# Patient Record
Sex: Female | Born: 1995
Health system: Southern US, Community
[De-identification: ages and names within clinical notes are randomized; demographics above are authoritative.]

## PROBLEM LIST (undated history)

## (undated) DIAGNOSIS — Z8759 Personal history of other complications of pregnancy, childbirth and the puerperium: Secondary | ICD-10-CM

## (undated) DIAGNOSIS — D649 Anemia, unspecified: Secondary | ICD-10-CM

## (undated) DIAGNOSIS — M419 Scoliosis, unspecified: Secondary | ICD-10-CM

## (undated) HISTORY — DX: Anemia, unspecified: D64.9

## (undated) HISTORY — PX: OTHER SURGICAL HISTORY: SHX169

## (undated) HISTORY — PX: WISDOM TOOTH EXTRACTION: SHX21

## (undated) HISTORY — DX: Personal history of other complications of pregnancy, childbirth and the puerperium: Z87.59

---
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
## 2014-01-04 ENCOUNTER — Encounter (HOSPITAL_COMMUNITY): Payer: Self-pay | Admitting: Emergency Medicine

## 2014-01-04 ENCOUNTER — Emergency Department (INDEPENDENT_AMBULATORY_CARE_PROVIDER_SITE_OTHER)
Admission: EM | Admit: 2014-01-04 | Discharge: 2014-01-04 | Disposition: A | Discharge status: Home / Self Care | Payer: Self-pay | Source: Home / Self Care | Attending: Family Medicine | Admitting: Family Medicine

## 2014-01-04 DIAGNOSIS — L0231 Cutaneous abscess of buttock: Secondary | ICD-10-CM

## 2014-01-04 DIAGNOSIS — L03317 Cellulitis of buttock: Secondary | ICD-10-CM

## 2014-01-04 MED ORDER — SULFAMETHOXAZOLE-TRIMETHOPRIM 800-160 MG PO TABS: 1.0000 | ORAL_TABLET | Freq: Two times a day (BID) | ORAL | Status: AC

## 2014-01-04 NOTE — ED Provider Notes (Addendum)
CSN: [id number]     Arrival date & time 01/04/14  1716 History   None    Chief Complaint  Patient presents with  . Abscess     (Consider location/radiation/quality/duration/timing/severity/associated sxs/prior Treatment) Patient is a 18 y.o. female presenting with abscess. The history is provided by the patient.  Abscess Location:  Ano-genital Ano-genital abscess location:  R buttock Abscess quality: fluctuance, induration and painful   Red streaking: no   Duration:  5 days Progression:  Worsening Chronicity:  Recurrent (similar 7230yr ago.told pilonidal cyst, had i+d)   Past Medical History  Diagnosis Date  . Pilonidal cyst    Past Surgical History  Procedure Laterality Date  . Incise and drain abcess  2012    pilonidal cyst   No family history on file. History  Substance Use Topics  . Smoking status: Never Smoker   . Smokeless tobacco: Never Used  . Alcohol Use: No   OB History   Grav Para Term Preterm Abortions TAB SAB Ect Mult Living                 Review of Systems  Constitutional: Negative.   Skin: Positive for rash.      Allergies  Review of patient's allergies indicates no known allergies.  Home Medications   Current Outpatient Rx  Name  Route  Sig  Dispense  Refill  . sulfamethoxazole-trimethoprim (BACTRIM DS,SEPTRA DS) 800-160 MG per tablet   Oral   Take 1 tablet by mouth 2 (two) times daily.   14 tablet   0    BP 93/62  Pulse 85  Temp(Src) 97.6 F (36.4 C) (Oral)  Resp 16  SpO2 98%  LMP 12/24/2013 Physical Exam  Nursing note and vitals reviewed. Constitutional: She is oriented to person, place, and time. She appears well-developed and well-nourished. She appears distressed.  Neurological: She is alert and oriented to person, place, and time.  Skin: Skin is warm and dry.  Right medial gluteal abscess with old i+d site.    ED Course  INCISION AND DRAINAGE Date/Time: 01/04/2014 6:59 PM Performed by: Linna HoffKINDL, Wyndi Northrup D Authorized by:  Bradd CanaryKINDL, Xaiver Roskelley D Consent: Verbal consent obtained. Risks and benefits: risks, benefits and alternatives were discussed Consent given by: patient Type: abscess Body area: anogenital Location details: gluteal cleft Anesthesia: local infiltration Local anesthetic: bupivacaine 0.5% with epinephrine Patient sedated: no Scalpel size: 15 Incision type: single straight Complexity: simple Drainage: purulent Drainage amount: copious Wound treatment: drain placed Packing material: 1/2 in iodoform gauze Patient tolerance: Patient tolerated the procedure well with no immediate complications.   (including critical care time) Labs Review Labs Reviewed - No data to display Imaging Review No results found.    MDM   Final diagnoses:  Abscess of gluteal cleft        Linna HoffJames D Slayde Brault, MD 01/04/14 1905  Linna HoffJames D Shaka Cardin, MD 01/04/14 2226

## 2014-01-04 NOTE — ED Notes (Signed)
"  pilonidal cyst", history of the same, reports this episode onset 2/6.

## 2014-01-04 NOTE — ED Notes (Signed)
Assisted with i/d .  Applied dressing to site.

## 2014-01-04 NOTE — Discharge Instructions (Signed)
Warm compress twice a day when you take the antibiotic, take all of medicine, return as needed. °

## 2014-01-06 ENCOUNTER — Encounter (HOSPITAL_COMMUNITY): Payer: Self-pay | Admitting: Emergency Medicine

## 2014-01-06 ENCOUNTER — Emergency Department (INDEPENDENT_AMBULATORY_CARE_PROVIDER_SITE_OTHER)
Admission: EM | Admit: 2014-01-06 | Discharge: 2014-01-06 | Disposition: A | Discharge status: Home / Self Care | Payer: Self-pay | Source: Home / Self Care | Attending: Family Medicine | Admitting: Family Medicine

## 2014-01-06 DIAGNOSIS — L03317 Cellulitis of buttock: Secondary | ICD-10-CM

## 2014-01-06 DIAGNOSIS — L0231 Cutaneous abscess of buttock: Secondary | ICD-10-CM

## 2014-01-06 NOTE — ED Notes (Signed)
Pt is here for follow up after abscess removal on Wednesday. Stated that she is currently taking the antibiotics that were prescribed to her. Also stated that she has been having cold chills, nausea, light headedness, dizziness, and fever since Wednesday. Last night temp was 101.8, then 101.5. Written by: Marga MelnickQuaNeisha Jones, SMA

## 2014-01-06 NOTE — Discharge Instructions (Signed)
Thank you for coming in today. Continue antibiotics.  Remove the packing in 2-3 days in the bathtub.  It is ok if the packing falls out on its own sooner.  Come back as needed.   Abscess Care After An abscess (also called a boil or furuncle) is an infected area that contains a collection of pus. Signs and symptoms of an abscess include pain, tenderness, redness, or hardness, or you may feel a moveable soft area under your skin. An abscess can occur anywhere in the body. The infection may spread to surrounding tissues causing cellulitis. A cut (incision) by the surgeon was made over your abscess and the pus was drained out. Gauze may have been packed into the space to provide a drain that will allow the cavity to heal from the inside outwards. The boil may be painful for 5 to 7 days. Most people with a boil do not have high fevers. Your abscess, if seen early, may not have localized, and may not have been lanced. If not, another appointment may be required for this if it does not get better on its own or with medications. HOME CARE INSTRUCTIONS   Only take over-the-counter or prescription medicines for pain, discomfort, or fever as directed by your caregiver.  When you bathe, soak and then remove gauze or iodoform packs at least daily or as directed by your caregiver. You may then wash the wound gently with mild soapy water. Repack with gauze or do as your caregiver directs. SEEK IMMEDIATE MEDICAL CARE IF:   You develop increased pain, swelling, redness, drainage, or bleeding in the wound site.  You develop signs of generalized infection including muscle aches, chills, fever, or a general ill feeling.  An oral temperature above 102 F (38.9 C) develops, not controlled by medication. See your caregiver for a recheck if you develop any of the symptoms described above. If medications (antibiotics) were prescribed, take them as directed. Document Released: 05/29/2005 Document Revised: 02/02/2012  Document Reviewed: 01/24/2008 [facility] Patient Information 2014 [place], [place].

## 2014-01-06 NOTE — ED Provider Notes (Signed)
Holly Abbott is a 18 y.o. female who presents to Urgent Care today for followup right buttocks abscess. Patient was seen 2 days prior for abscess. Was treated with incision and drainage packing and antibiotics. She feels well denies any fevers or chills nausea vomiting or diarrhea.    Past Medical History  Diagnosis Date  . Pilonidal cyst    History  Substance Use Topics  . Smoking status: Never Smoker   . Smokeless tobacco: Never Used  . Alcohol Use: No   ROS as above Medications: No current facility-administered medications for this encounter.   Current Outpatient Prescriptions  Medication Sig Dispense Refill  . sulfamethoxazole-trimethoprim (BACTRIM DS,SEPTRA DS) 800-160 MG per tablet Take 1 tablet by mouth 2 (two) times daily.  14 tablet  0    Exam:  BP 102/69  Pulse 90  Temp(Src) 98.4 F (36.9 C) (Oral)  Resp 16  SpO2 100%  LMP 12/24/2013 Gen: Well NAD SKIN: Right buttocks. Wound with pus emerging and packing in place.  Nontender without any erythema or induration.  Packing removed and the wound inspected with Q-tip breaking up loculations.  Wound repacked with 5 inches of quarter inch packing material.  Patient tolerated procedure well   Assessment and Plan: 18 y.o. female with abscess.  Continue antibiotics.  Followup as needed.  Remove packing material in about 3 days.  Discussed warning signs or symptoms. Please see discharge instructions. Patient expresses understanding.    Rodolph BongEvan S Constantinos Krempasky, MD 01/06/14 [phone]

## 2014-05-02 ENCOUNTER — Encounter (HOSPITAL_COMMUNITY): Payer: Self-pay | Admitting: Emergency Medicine

## 2014-05-02 ENCOUNTER — Emergency Department (HOSPITAL_COMMUNITY)
Admission: EM | Admit: 2014-05-02 | Discharge: 2014-05-02 | Disposition: A | Discharge status: Home / Self Care | Payer: Self-pay | Attending: Emergency Medicine | Admitting: Emergency Medicine

## 2014-05-02 DIAGNOSIS — L0501 Pilonidal cyst with abscess: Secondary | ICD-10-CM | POA: Insufficient documentation

## 2014-05-02 MED ORDER — TRAMADOL HCL 50 MG PO TABS: 50.0000 mg | ORAL_TABLET | Freq: Four times a day (QID) | ORAL | Status: DC | PRN

## 2014-05-02 NOTE — Discharge Instructions (Signed)
Pilonidal Cyst, Care After A pilonidal cyst occurs when hairs get trapped (ingrown) beneath the skin in the crease between the buttocks over your sacrum (the bone under that crease). Pilonidal cysts are most common in young men with a lot of body hair. When the cyst breaks(ruptured) or leaks, fluid from the cyst may cause burning and itching. If the cyst becomes infected, it causes a painful swelling filled with pus (abscess). The pus and trapped hairs need to be removed (often by lancing) so that the infection can heal. The word pilonidal means hair nest. HOME CARE INSTRUCTIONS If the pilonidal sinus was NOT DRAINING OR LANCED:  Keep the area clean and dry. Bathe or shower daily. Wash the area well with a germ-killing soap. Hot tub baths may help prevent infection. Dry the area well with a towel.  Avoid tight clothing in order to keep area as moisture-free as possible.  Keep area between buttocks as free from hair as possible. A depilatory may be used.  Take antibiotics as directed.  Only take over-the-counter or prescription medicines for pain, discomfort, or fever as directed by your caregiver. If the cyst WAS INFECTED AND NEEDED TO BE DRAINED:  Your caregiver may have packed the wound with gauze to keep the wound open. This allows the wound to heal from the inside outward and continue to drain.  Return as directed for a wound check.  If you take tub baths or showers, repack the wound with gauze as directed following. Sponge baths are a good alternative. Sitz baths may be used three to four times a day or as directed.  If an antibiotic was ordered to fight the infection, take as directed.  Only take over-the-counter or prescription medicines for pain, discomfort, or fever as directed by your caregiver.  If a drain was in place and removed, use sitz baths for 20 minutes 4 times per day. Clean the wound gently with mild unscented soap, pat dry, and then apply a dry dressing as  directed. If you had surgery and IT WAS MARSUPIALIZED (LEFT OPEN):  Your wound was packed with gauze to keep the wound open. This allows the wound to heal from the inside outwards and continue draining. The changing of the dressing regularly also helps keep the wound clean.  Return as directed for a wound check.  If you take tub baths or showers, repack the wound with gauze as directed following. Sponge baths are a good alternative. Sitz baths can also be used. This may be done three to four times a day or as directed.  If an antibiotic was ordered to fight the infection, take as directed.  Only take over-the-counter or prescription medicines for pain, discomfort, or fever as directed by your caregiver.  If you had surgery and the wound was closed you may care for it as directed. This generally includes keeping it dry and clean and dressing it as directed. SEEK MEDICAL CARE IF:   You have increased pain, swelling, redness, drainage, or bleeding from the area.  You have a fever.  You have muscles aches, dizziness, or a general ill feeling. Document Released: 12/11/2006 Document Revised: 07/13/2013 Document Reviewed: 02/25/2007 ExitCare Patient Information 2014 [employer]. Pilonidal Cyst A pilonidal cyst occurs when hairs get trapped (ingrown) beneath the skin in the crease between the buttocks over your sacrum (the bone under that crease). Pilonidal cysts are most common in young men with a lot of body hair. When the cyst is ruptured (breaks) or leaking, fluid   from the cyst may cause burning and itching. If the cyst becomes infected, it causes a painful swelling filled with pus (abscess). The pus and trapped hairs need to be removed (often by lancing) so that the infection can heal. However, recurrence is common and an operation may be needed to remove the cyst. HOME CARE INSTRUCTIONS   If the cyst was NOT INFECTED:  Keep the area clean and dry. Bathe or shower daily. Wash the area  well with a germ-killing soap. Warm tub baths may help prevent infection and help with drainage. Dry the area well with a towel.  Avoid tight clothing to keep area as moisture free as possible.  Keep area between buttocks as free of hair as possible. A depilatory may be used.  If the cyst WAS INFECTED and needed to be drained:  Your caregiver packed the wound with gauze to keep the wound open. This allows the wound to heal from the inside outwards and continue draining.  Return for a wound check in 1 day or as suggested.  If you take tub baths or showers, repack the wound with gauze following them. Sponge baths (at the sink) are a good alternative.  If an antibiotic was ordered to fight the infection, take as directed.  Only take over-the-counter or prescription medicines for pain, discomfort, or fever as directed by your caregiver.  After the drain is removed, use sitz baths for 20 minutes 4 times per day. Clean the wound gently with mild unscented soap, pat dry, and then apply a dry dressing. SEEK MEDICAL CARE IF:   You have increased pain, swelling, redness, drainage, or bleeding from the area.  You have a fever.  You have muscles aches, dizziness, or a general ill feeling. Document Released: 11/07/2000 Document Revised: 02/02/2012 Document Reviewed: 01/05/2009 ExitCare Patient Information 2014 [employer], LLC.  

## 2014-05-02 NOTE — ED Notes (Signed)
Patient given work note at DC

## 2014-05-02 NOTE — ED Notes (Signed)
Patient is alert and oriented x3.  She is complaining of a cyst that she has had before. She states this is the 3rd time that she has had this cyst in the same area.  Currently she rates Her pain 2 of 10.

## 2014-05-02 NOTE — ED Provider Notes (Signed)
CSN: [id number]     Arrival date & time 05/02/14  0047 History   First MD Initiated Contact with Patient 05/02/14 [phone]     Chief Complaint  Patient presents with  . Cyst     (Consider location/radiation/quality/duration/timing/severity/associated sxs/prior Treatment) HPI Pt is a 18yo female with hx of pilonidal cyst presenting to ED c/o gradually worsening cyst pain that started 2-3 days ago. Pt states this is her 3rd cyst. Pt reports pain being constant, aching, throbbing, worse with palpation but states pain has improved, 2/10 after it burst open last night as she states she has been using warm wash cloths and warm water soaks. Denies fever, n/v/d. Denies abdominal pain, urinary or vaginal symptoms.   Past Medical History  Diagnosis Date  . Pilonidal cyst    Past Surgical History  Procedure Laterality Date  . Incise and drain abcess  2012    pilonidal cyst   History reviewed. No pertinent family history. History  Substance Use Topics  . Smoking status: Never Smoker   . Smokeless tobacco: Never Used  . Alcohol Use: No   OB History   Grav Para Term Preterm Abortions TAB SAB Ect Mult Living                 Review of Systems  Constitutional: Negative for fever and chills.  Gastrointestinal: Negative for nausea, vomiting, abdominal pain and diarrhea.  Genitourinary: Negative for dysuria, hematuria and flank pain.  Musculoskeletal: Positive for myalgias ( buttock). Negative for back pain.  Skin: Positive for wound ( pylinidal abscess). Negative for color change.  All other systems reviewed and are negative.     Allergies  Review of patient's allergies indicates no known allergies.  Home Medications   Prior to Admission medications   Medication Sig Start Date End Date Taking? Authorizing Provider  ibuprofen (ADVIL,MOTRIN) 200 MG tablet Take 400 mg by mouth every 6 (six) hours as needed (pain).   Yes Historical Provider, MD  traMADol (ULTRAM) 50 MG tablet Take 1 tablet  (50 mg total) by mouth every 6 (six) hours as needed. 05/02/14   Junius Finner, PA-C   BP 112/66  Pulse 85  Temp(Src) 98.3 F (36.8 C) (Oral)  Resp 20  SpO2 100%  LMP 04/07/2014 Physical Exam  Nursing note and vitals reviewed. Constitutional: She is oriented to person, place, and time. She appears well-developed and well-nourished.  Pt appears well, non-toxic. NAD.  HENT:  Head: Normocephalic and atraumatic.  Eyes: EOM are normal.  Neck: Normal range of motion.  Cardiovascular: Normal rate.   Pulmonary/Chest: Effort normal.  Abdominal: Soft. She exhibits no distension. There is no tenderness.  Musculoskeletal: Normal range of motion.  Neurological: She is alert and oriented to person, place, and time.  Skin: Skin is warm and dry. There is erythema.  pilonidal cyst present on right buttock, open, actively drainage serosanguinous discharge.  2cm area of induration and tenderness with erythema.    Psychiatric: She has a normal mood and affect. Her behavior is normal.    ED Course  Procedures (including critical care time) Labs Review Labs Reviewed - No data to display  Imaging Review No results found.   EKG Interpretation None      MDM   Final diagnoses:  Pilonidal cyst with abscess    Pt is a 18yo female with hx of pilonidal cyst c/o recurrent cyst with pain x3 days. Reports cyst bursting at home after warm compresses. Cyst now draining on its own.  Discussed  with pt she may continue with warm compresses and help cyst continue to drain, or to perform I&D to allow larger opening for drainage. Pt declined I&D.  Advised pt to continue warm compresses and warm soaks. Rx: tramadol. Also advised to f/u with general surgery for recurrent cysts. Return precautions provided. Pt verbalized understanding and agreement with tx plan.     Junius FinnerErin O'Malley, PA-C 05/03/14 [phone]

## 2014-05-03 NOTE — ED Provider Notes (Signed)
Medical screening examination/treatment/procedure(s) were performed by non-physician practitioner and as supervising physician I was immediately available for consultation/collaboration.   EKG Interpretation None        Enid Skeens, MD 05/03/14 [phone]

## 2015-11-05 ENCOUNTER — Other Ambulatory Visit: Payer: Self-pay | Admitting: Internal Medicine

## 2015-11-25 NOTE — L&D Delivery Note (Signed)
Patient was C/C/+2 and pushed for 10 minutes with epidural.    NSVD  female infant, Apgars 7,9, weight P.   The patient had a first degree R labial laceration repaired with 3-0 vicryl R. Fundus was firm. EBL was expected amount. Placenta was delivered intact. Vagina was clear.  Baby was vigorous and doing skin to skin with mother.  Parents desire circumcision.  Bennette Hasty A

## 2016-01-23 LAB — OB RESULTS CONSOLE HEPATITIS B SURFACE ANTIGEN: HEP B S AG: NEGATIVE

## 2016-01-23 LAB — OB RESULTS CONSOLE ABO/RH: RH TYPE: POSITIVE

## 2016-01-23 LAB — OB RESULTS CONSOLE RPR: RPR: NONREACTIVE

## 2016-01-23 LAB — OB RESULTS CONSOLE ANTIBODY SCREEN: Antibody Screen: NEGATIVE

## 2016-01-23 LAB — OB RESULTS CONSOLE HIV ANTIBODY (ROUTINE TESTING)
HIV: NONREACTIVE
HIV: NONREACTIVE

## 2016-01-23 LAB — OB RESULTS CONSOLE GC/CHLAMYDIA
Chlamydia: NEGATIVE
GC PROBE AMP, GENITAL: NEGATIVE

## 2016-01-23 LAB — OB RESULTS CONSOLE RUBELLA ANTIBODY, IGM: Rubella: IMMUNE

## 2016-05-11 ENCOUNTER — Encounter (HOSPITAL_COMMUNITY): Payer: Self-pay | Admitting: Emergency Medicine

## 2016-05-11 ENCOUNTER — Emergency Department (HOSPITAL_COMMUNITY)
Admission: EM | Admit: 2016-05-11 | Discharge: 2016-05-11 | Disposition: A | Discharge status: Home / Self Care | Payer: BLUE CROSS/BLUE SHIELD | Attending: Emergency Medicine | Admitting: Emergency Medicine

## 2016-05-11 DIAGNOSIS — O26893 Other specified pregnancy related conditions, third trimester: Secondary | ICD-10-CM | POA: Insufficient documentation

## 2016-05-11 DIAGNOSIS — R197 Diarrhea, unspecified: Secondary | ICD-10-CM | POA: Diagnosis not present

## 2016-05-11 DIAGNOSIS — R1030 Lower abdominal pain, unspecified: Secondary | ICD-10-CM | POA: Diagnosis present

## 2016-05-11 DIAGNOSIS — M545 Low back pain: Secondary | ICD-10-CM | POA: Diagnosis not present

## 2016-05-11 DIAGNOSIS — Z3A32 32 weeks gestation of pregnancy: Secondary | ICD-10-CM | POA: Diagnosis not present

## 2016-05-11 LAB — URINALYSIS, ROUTINE W REFLEX MICROSCOPIC
BILIRUBIN URINE: NEGATIVE
BILIRUBIN URINE: NEGATIVE
GLUCOSE, UA: NEGATIVE mg/dL
Glucose, UA: NEGATIVE mg/dL
HGB URINE DIPSTICK: NEGATIVE
Hgb urine dipstick: NEGATIVE
KETONES UR: NEGATIVE mg/dL
Ketones, ur: NEGATIVE mg/dL
Leukocytes, UA: NEGATIVE
NITRITE: NEGATIVE
Nitrite: NEGATIVE
PH: 7 (ref 5.0–8.0)
Protein, ur: NEGATIVE mg/dL
Protein, ur: NEGATIVE mg/dL
SPECIFIC GRAVITY, URINE: 1.006 (ref 1.005–1.030)
Specific Gravity, Urine: 1.023 (ref 1.005–1.030)
pH: 6.5 (ref 5.0–8.0)

## 2016-05-11 LAB — URINE MICROSCOPIC-ADD ON: RBC / HPF: NONE SEEN RBC/hpf (ref 0–5)

## 2016-05-11 MED ORDER — SODIUM CHLORIDE 0.9 % IV BOLUS (SEPSIS)
1000.0000 mL | Freq: Once | INTRAVENOUS | Status: AC
  Administered 2016-05-11: 1000 mL via INTRAVENOUS

## 2016-05-11 NOTE — ED Notes (Signed)
Pt [redacted] weeks pregnant, c/o constant sharp lower back and lower abdominal pain. Denies bleeding, N/V.

## 2016-05-11 NOTE — Progress Notes (Signed)
U/A is negative. Pt says that she and her husband went to a wedding reception last night and several people that were there havre c/o diarreah also. Labor precautions given to pt. Verbalizes understanding. Instructed to call the office in the morning for an appointment this week.

## 2016-05-11 NOTE — Progress Notes (Signed)
Spoke with Dr. Mora ApplPinn. Pt is having uc's 5-20 min. FHR is a category 1 tracing. Cervix is closed, thk, posterior. No bloody show or vaginal bleeding. Pt has had a liter of IVF. U/A came back as a contaminated specimen. ED MD has ordered a cath U/A. Dr. Mora ApplPinn says the pt can be treated with macrobid 100mg  po bid for 7 days. Pt is to follow up in the office at the beginning of the week. She is to go to [facility] if she has signs of labor.

## 2016-05-11 NOTE — Discharge Instructions (Signed)
It is safe to take Tylenol as directed for pain while pregnant. Avoid milk or foods containing no such as cheese or ice cream while having diarrhea. Drink at least six 8 ounce glasses of water daily. If you have any problems that you think might be related to the pregnancy, call Dr.Horvath or [facility].

## 2016-05-11 NOTE — ED Notes (Signed)
Patient is [redacted] weeks pregnant.  She awakened this morning and began having lower back pain and pain across her abdomen.  She also had several episodes of diarrhea, but denies nausea or vomiting.  Denies spotting.  She sees Dr. Ardith DarkHogarth.

## 2016-05-11 NOTE — Progress Notes (Signed)
Spoke with Dr. Mora ApplPinn. Pt is to receive a liter bolus of NS and OBRR is to check her cervix. If she is not  Dilated, she can be d/c home.

## 2016-05-11 NOTE — Progress Notes (Signed)
In and out cath done by ED RN

## 2016-05-11 NOTE — Progress Notes (Signed)
Pt is a G1P0 at 32 2/[redacted] weeks gestation with c/o abd and back pain since 0730 this morning. Says she has had diarreah 3 times since then. [facility]. Denies any problems with this pregnancy. Denies vaginal bleeding or leaking of fluid.

## 2016-05-11 NOTE — ED Provider Notes (Signed)
CSN: [id number]     Arrival date & time 05/11/16  1138 History   First MD Initiated Contact with Patient 05/11/16 1152     Chief Complaint  Patient presents with  . Abdominal Pain     (Consider location/radiation/quality/duration/timing/severity/associated sxs/prior Treatment) HPI complains of low back pain radiating to lower abdomen started last night. Constant, crampy, nothing makes pain better or worse. Associated symptoms include 2 episodes of diarrhea. No fever. No urinary symptoms. No vaginal bleeding or discharge No other associated symptoms no treatment prior to coming here pain is moderate at present. Patient is currently 32 weeks 3 days pregnant, she reports pregnancy going well and without consultation. Followed by Dr.Horvath  History reviewed. No pertinent past medical history. Past medical history negative History reviewed. No pertinent past surgical history. History reviewed. No pertinent family history. Social History  Substance Use Topics  . Smoking status: Never Smoker   . Smokeless tobacco: None  . Alcohol Use: None   OB History    Gravida Para Term Preterm AB TAB SAB Ectopic Multiple Living   1              Review of Systems  Constitutional: Negative.   HENT: Negative.   Respiratory: Negative.   Cardiovascular: Negative.   Gastrointestinal: Positive for abdominal pain and diarrhea.  Genitourinary:       Pregnant [redacted] weeks 3 days  Musculoskeletal: Positive for back pain.  Skin: Negative.   Neurological: Negative.   Psychiatric/Behavioral: Negative.   All other systems reviewed and are negative.     Allergies  Review of patient's allergies indicates no known allergies.  Home Medications   Prior to Admission medications   Not on File   BP 124/79 mmHg  Pulse 83  Temp(Src) 97.7 F (36.5 C) (Oral)  Resp 16  SpO2 98%  LMP 09/28/2015 (Exact Date) Physical Exam  Constitutional: She is oriented to person, place, and time. She appears well-developed  and well-nourished. No distress.  HENT:  Head: Normocephalic and atraumatic.  Eyes: Conjunctivae are normal. Pupils are equal, round, and reactive to light.  Neck: Neck supple. No tracheal deviation present. No thyromegaly present.  Cardiovascular: Normal rate and regular rhythm.   No murmur heard. Pulmonary/Chest: Effort normal and breath sounds normal.  Abdominal: Soft. Bowel sounds are normal. She exhibits no distension. There is no tenderness.  Gravid. Fetal heart tones in the 140s  Musculoskeletal: Normal range of motion. She exhibits no edema or tenderness.  Pain at low back and sitting up from supine position. Note point tenderness no flank tenderness  Neurological: She is alert and oriented to person, place, and time. Coordination normal.  Skin: Skin is warm and dry. No rash noted.  Psychiatric: She has a normal mood and affect.  Nursing note and vitals reviewed.   ED Course  Procedures (including critical care time) Labs Review Labs Reviewed - No data to display  Imaging Review No results found. I have personally reviewed and evaluated these images and lab results as part of my medical decision-making.   EKG Interpretation None     Declines pain medicine Patient received IV hydration and feels much improved at 3:45 PM. OB rapid response nurse consult on patient. No significant contractions. Her cervix was checked by OB rapid response nurse and felt to be closed.  Results for orders placed or performed during the hospital encounter of 05/11/16  Urinalysis, Routine w reflex microscopic (not at [facility])  Result Value Ref Range   Color, Urine  YELLOW YELLOW   APPearance CLOUDY (A) CLEAR   Specific Gravity, Urine 1.023 1.005 - 1.030   pH 6.5 5.0 - 8.0   Glucose, UA NEGATIVE NEGATIVE mg/dL   Hgb urine dipstick NEGATIVE NEGATIVE   Bilirubin Urine NEGATIVE NEGATIVE   Ketones, ur NEGATIVE NEGATIVE mg/dL   Protein, ur NEGATIVE NEGATIVE mg/dL   Nitrite NEGATIVE NEGATIVE    Leukocytes, UA MODERATE (A) NEGATIVE  Urine microscopic-add on  Result Value Ref Range   Squamous Epithelial / LPF TOO NUMEROUS TO COUNT (A) NONE SEEN   WBC, UA 6-30 0 - 5 WBC/hpf   RBC / HPF NONE SEEN 0 - 5 RBC/hpf   Bacteria, UA MANY (A) NONE SEEN   Urine-Other MUCOUS PRESENT   Urinalysis, Routine w reflex microscopic (not at [facility])  Result Value Ref Range   Color, Urine YELLOW YELLOW   APPearance CLEAR CLEAR   Specific Gravity, Urine 1.006 1.005 - 1.030   pH 7.0 5.0 - 8.0   Glucose, UA NEGATIVE NEGATIVE mg/dL   Hgb urine dipstick NEGATIVE NEGATIVE   Bilirubin Urine NEGATIVE NEGATIVE   Ketones, ur NEGATIVE NEGATIVE mg/dL   Protein, ur NEGATIVE NEGATIVE mg/dL   Nitrite NEGATIVE NEGATIVE   Leukocytes, UA NEGATIVE NEGATIVE   No results found. Catheterized urine was obtained as initial voided specimen was contaminated MDM  Patient is stable for discharge. Plan avoid dairy products encourage oral hydration Tylenol for pain. Follow-up with Dr. Henderson Cloud  Diagnosis #1 low back pain #2 diarrhea Final diagnoses:  None        Doug Sou, MD 05/11/16 1610

## 2016-06-09 LAB — OB RESULTS CONSOLE GBS: GBS: NEGATIVE

## 2016-06-09 LAB — OB RESULTS CONSOLE RPR: RPR: NONREACTIVE

## 2016-06-15 ENCOUNTER — Inpatient Hospital Stay (HOSPITAL_COMMUNITY): Payer: BLUE CROSS/BLUE SHIELD

## 2016-06-15 ENCOUNTER — Encounter (HOSPITAL_COMMUNITY): Payer: Self-pay

## 2016-06-15 ENCOUNTER — Inpatient Hospital Stay (HOSPITAL_COMMUNITY)
Admission: AD | Admit: 2016-06-15 | Discharge: 2016-06-15 | Disposition: A | Discharge status: Home / Self Care | Payer: BLUE CROSS/BLUE SHIELD | Source: Ambulatory Visit | Attending: Obstetrics and Gynecology | Admitting: Obstetrics and Gynecology

## 2016-06-15 DIAGNOSIS — Z3A37 37 weeks gestation of pregnancy: Secondary | ICD-10-CM | POA: Insufficient documentation

## 2016-06-15 DIAGNOSIS — N898 Other specified noninflammatory disorders of vagina: Secondary | ICD-10-CM | POA: Insufficient documentation

## 2016-06-15 DIAGNOSIS — O288 Other abnormal findings on antenatal screening of mother: Secondary | ICD-10-CM

## 2016-06-15 DIAGNOSIS — O26893 Other specified pregnancy related conditions, third trimester: Secondary | ICD-10-CM | POA: Diagnosis present

## 2016-06-15 LAB — POCT FERN TEST: POCT FERN TEST: NEGATIVE

## 2016-06-15 LAB — AMNISURE RUPTURE OF MEMBRANE (ROM) NOT AT ARMC: Amnisure ROM: NEGATIVE

## 2016-06-15 NOTE — MAU Provider Note (Signed)
Holly Abbott 20 y.o. [redacted]w[redacted]d Comes to MAU after feeling fluid come out while at church.  No contractions.  Does not know if it is urine or ROM. Speculum exam done:   Creamy vaginal discharge noted, but no pooling or leaking with valsalva.  Fern slide done.  RN to read, but clinical appearance does not support any rupture of membranes.

## 2016-06-15 NOTE — MAU Note (Signed)
Pt c/o rupture of membranes starting at 1345 pm. Pt denies bleeding and contractions. Pt states baby is moving well.

## 2016-06-19 ENCOUNTER — Inpatient Hospital Stay (HOSPITAL_COMMUNITY)
Admission: AD | Admit: 2016-06-19 | Discharge: 2016-06-21 | DRG: 775 | Disposition: A | Discharge status: Home / Self Care | Payer: BLUE CROSS/BLUE SHIELD | Source: Ambulatory Visit | Attending: Obstetrics and Gynecology | Admitting: Obstetrics and Gynecology

## 2016-06-19 ENCOUNTER — Inpatient Hospital Stay (HOSPITAL_COMMUNITY): Payer: BLUE CROSS/BLUE SHIELD | Admitting: Anesthesiology

## 2016-06-19 ENCOUNTER — Encounter (HOSPITAL_COMMUNITY): Payer: Self-pay | Admitting: *Deleted

## 2016-06-19 DIAGNOSIS — O99214 Obesity complicating childbirth: Secondary | ICD-10-CM | POA: Diagnosis present

## 2016-06-19 DIAGNOSIS — Z6830 Body mass index (BMI) 30.0-30.9, adult: Secondary | ICD-10-CM | POA: Diagnosis not present

## 2016-06-19 DIAGNOSIS — Z3A37 37 weeks gestation of pregnancy: Secondary | ICD-10-CM

## 2016-06-19 DIAGNOSIS — O4202 Full-term premature rupture of membranes, onset of labor within 24 hours of rupture: Secondary | ICD-10-CM | POA: Diagnosis present

## 2016-06-19 DIAGNOSIS — O163 Unspecified maternal hypertension, third trimester: Secondary | ICD-10-CM

## 2016-06-19 DIAGNOSIS — O9962 Diseases of the digestive system complicating childbirth: Secondary | ICD-10-CM | POA: Diagnosis present

## 2016-06-19 DIAGNOSIS — O1414 Severe pre-eclampsia complicating childbirth: Secondary | ICD-10-CM | POA: Diagnosis present

## 2016-06-19 DIAGNOSIS — O4292 Full-term premature rupture of membranes, unspecified as to length of time between rupture and onset of labor: Secondary | ICD-10-CM

## 2016-06-19 DIAGNOSIS — E669 Obesity, unspecified: Secondary | ICD-10-CM | POA: Diagnosis present

## 2016-06-19 DIAGNOSIS — O4290 Premature rupture of membranes, unspecified as to length of time between rupture and onset of labor, unspecified weeks of gestation: Secondary | ICD-10-CM | POA: Diagnosis present

## 2016-06-19 DIAGNOSIS — K219 Gastro-esophageal reflux disease without esophagitis: Secondary | ICD-10-CM | POA: Diagnosis present

## 2016-06-19 HISTORY — DX: Scoliosis, unspecified: M41.9

## 2016-06-19 LAB — CBC
HCT: 31.3 % — ABNORMAL LOW (ref 36.0–46.0)
HCT: 32.4 % — ABNORMAL LOW (ref 36.0–46.0)
Hemoglobin: 10.2 g/dL — ABNORMAL LOW (ref 12.0–15.0)
Hemoglobin: 10.8 g/dL — ABNORMAL LOW (ref 12.0–15.0)
MCH: 27.6 pg (ref 26.0–34.0)
MCH: 28.1 pg (ref 26.0–34.0)
MCHC: 32.6 g/dL (ref 30.0–36.0)
MCHC: 33.3 g/dL (ref 30.0–36.0)
MCV: 84.6 fL (ref 78.0–100.0)
MCV: 84.4 fL (ref 78.0–100.0)
PLATELETS: 305 10*3/uL (ref 150–400)
PLATELETS: 317 10*3/uL (ref 150–400)
RBC: 3.7 MIL/uL — ABNORMAL LOW (ref 3.87–5.11)
RBC: 3.84 MIL/uL — AB (ref 3.87–5.11)
RDW: 15.8 % — AB (ref 11.5–15.5)
RDW: 15.9 % — AB (ref 11.5–15.5)
WBC: 10.8 10*3/uL — AB (ref 4.0–10.5)
WBC: 17.9 10*3/uL — AB (ref 4.0–10.5)

## 2016-06-19 LAB — COMPREHENSIVE METABOLIC PANEL
ALK PHOS: 163 U/L — AB (ref 38–126)
ALT: 12 U/L — AB (ref 14–54)
AST: 32 U/L (ref 15–41)
Albumin: 2.9 g/dL — ABNORMAL LOW (ref 3.5–5.0)
Anion gap: 10 (ref 5–15)
BUN: 10 mg/dL (ref 6–20)
CALCIUM: 8.4 mg/dL — AB (ref 8.9–10.3)
CO2: 18 mmol/L — ABNORMAL LOW (ref 22–32)
CREATININE: 1.03 mg/dL — AB (ref 0.44–1.00)
Chloride: 108 mmol/L (ref 101–111)
Glucose, Bld: 85 mg/dL (ref 65–99)
Potassium: 4.1 mmol/L (ref 3.5–5.1)
Sodium: 136 mmol/L (ref 135–145)
Total Bilirubin: 0.5 mg/dL (ref 0.3–1.2)
Total Protein: 5.9 g/dL — ABNORMAL LOW (ref 6.5–8.1)

## 2016-06-19 LAB — PROTEIN / CREATININE RATIO, URINE
Creatinine, Urine: 114 mg/dL
Protein Creatinine Ratio: 1.11 mg/mg{Cre} — ABNORMAL HIGH (ref 0.00–0.15)
Total Protein, Urine: 127 mg/dL

## 2016-06-19 LAB — URINALYSIS, ROUTINE W REFLEX MICROSCOPIC
BILIRUBIN URINE: NEGATIVE
GLUCOSE, UA: NEGATIVE mg/dL
Ketones, ur: NEGATIVE mg/dL
Leukocytes, UA: NEGATIVE
Nitrite: NEGATIVE
PROTEIN: 100 mg/dL — AB
Specific Gravity, Urine: <1.005 — ABNORMAL LOW (ref 1.005–1.030)
pH: 6 (ref 5.0–8.0)

## 2016-06-19 LAB — POCT FERN TEST: POCT FERN TEST: POSITIVE

## 2016-06-19 LAB — URINE MICROSCOPIC-ADD ON

## 2016-06-19 LAB — URIC ACID: URIC ACID, SERUM: 7.9 mg/dL — AB (ref 2.3–6.6)

## 2016-06-19 LAB — TYPE AND SCREEN
ABO/RH(D): B POS
Antibody Screen: NEGATIVE

## 2016-06-19 LAB — LACTATE DEHYDROGENASE: LDH: 262 U/L — AB (ref 98–192)

## 2016-06-19 MED ORDER — OXYTOCIN 40 UNITS IN LACTATED RINGERS INFUSION - SIMPLE MED
1.0000 m[IU]/min | INTRAVENOUS | Status: DC
  Administered 2016-06-19: 14 m[IU]/min via INTRAVENOUS
  Administered 2016-06-19: 2 m[IU]/min via INTRAVENOUS

## 2016-06-19 MED ORDER — LIDOCAINE HCL (PF) 1 % IJ SOLN
INTRAMUSCULAR | Status: DC | PRN
  Administered 2016-06-19 (×2): 4 mL via EPIDURAL

## 2016-06-19 MED ORDER — OXYTOCIN 40 UNITS IN LACTATED RINGERS INFUSION - SIMPLE MED
2.5000 [IU]/h | INTRAVENOUS | Status: DC
  Filled 2016-06-19: qty 1000

## 2016-06-19 MED ORDER — FLEET ENEMA 7-19 GM/118ML RE ENEM: 1.0000 | ENEMA | RECTAL | Status: DC | PRN

## 2016-06-19 MED ORDER — PHENYLEPHRINE 40 MCG/ML (10ML) SYRINGE FOR IV PUSH (FOR BLOOD PRESSURE SUPPORT)
80.0000 ug | PREFILLED_SYRINGE | INTRAVENOUS | Status: DC | PRN
  Filled 2016-06-19: qty 5
  Filled 2016-06-19: qty 10

## 2016-06-19 MED ORDER — ACETAMINOPHEN 325 MG PO TABS: 650.0000 mg | ORAL_TABLET | ORAL | Status: DC | PRN

## 2016-06-19 MED ORDER — MAGNESIUM SULFATE 50 % IJ SOLN
2.0000 g/h | INTRAMUSCULAR | Status: DC
  Filled 2016-06-19: qty 80

## 2016-06-19 MED ORDER — FENTANYL 2.5 MCG/ML BUPIVACAINE 1/10 % EPIDURAL INFUSION (WH - ANES)
14.0000 mL/h | INTRAMUSCULAR | Status: DC | PRN
  Administered 2016-06-19: 14 mL/h via EPIDURAL
  Filled 2016-06-19: qty 125

## 2016-06-19 MED ORDER — LACTATED RINGERS IV SOLN
500.0000 mL | Freq: Once | INTRAVENOUS | Status: AC
  Administered 2016-06-19: 500 mL via INTRAVENOUS

## 2016-06-19 MED ORDER — LACTATED RINGERS IV SOLN
INTRAVENOUS | Status: DC
  Administered 2016-06-19 – 2016-06-20 (×2): via INTRAVENOUS

## 2016-06-19 MED ORDER — LACTATED RINGERS IV SOLN: 500.0000 mL | INTRAVENOUS | Status: DC | PRN

## 2016-06-19 MED ORDER — MAGNESIUM SULFATE BOLUS VIA INFUSION
4.0000 g | Freq: Once | INTRAVENOUS | Status: AC
  Administered 2016-06-19: 4 g via INTRAVENOUS
  Filled 2016-06-19: qty 500

## 2016-06-19 MED ORDER — SOD CITRATE-CITRIC ACID 500-334 MG/5ML PO SOLN: 30.0000 mL | ORAL | Status: DC | PRN

## 2016-06-19 MED ORDER — EPHEDRINE 5 MG/ML INJ
10.0000 mg | INTRAVENOUS | Status: DC | PRN
  Filled 2016-06-19: qty 4

## 2016-06-19 MED ORDER — OXYTOCIN BOLUS FROM INFUSION
500.0000 mL | Freq: Once | INTRAVENOUS | Status: AC
  Administered 2016-06-19: 500 mL via INTRAVENOUS

## 2016-06-19 MED ORDER — PHENYLEPHRINE 40 MCG/ML (10ML) SYRINGE FOR IV PUSH (FOR BLOOD PRESSURE SUPPORT)
80.0000 ug | PREFILLED_SYRINGE | INTRAVENOUS | Status: DC | PRN
  Filled 2016-06-19: qty 5

## 2016-06-19 MED ORDER — OXYCODONE-ACETAMINOPHEN 5-325 MG PO TABS: 2.0000 | ORAL_TABLET | ORAL | Status: DC | PRN

## 2016-06-19 MED ORDER — ONDANSETRON HCL 4 MG/2ML IJ SOLN: 4.0000 mg | Freq: Four times a day (QID) | INTRAMUSCULAR | Status: DC | PRN

## 2016-06-19 MED ORDER — LIDOCAINE HCL (PF) 1 % IJ SOLN
30.0000 mL | INTRAMUSCULAR | Status: DC | PRN
  Filled 2016-06-19: qty 30

## 2016-06-19 MED ORDER — TERBUTALINE SULFATE 1 MG/ML IJ SOLN: 0.2500 mg | Freq: Once | INTRAMUSCULAR | Status: DC | PRN

## 2016-06-19 MED ORDER — DIPHENHYDRAMINE HCL 50 MG/ML IJ SOLN: 12.5000 mg | INTRAMUSCULAR | Status: DC | PRN

## 2016-06-19 MED ORDER — OXYCODONE-ACETAMINOPHEN 5-325 MG PO TABS: 1.0000 | ORAL_TABLET | ORAL | Status: DC | PRN

## 2016-06-19 NOTE — MAU Provider Note (Addendum)
History   [id number]   Chief Complaint  Patient presents with  . Rupture of Membranes    HPI Holly Abbott is a 20 y.o. female  G1P0 here with report of watery vaginal discharge that began at approximately 0430 this morning.  Leaking of fluid has continued.  Pt reports not feeling contractions and denies vaginal bleeding. Positive fetal movement.   All other systems negative.    Patient's last menstrual period was 09/28/2015 (exact date).  OB History  Gravida Para Term Preterm AB Living  1         0  SAB TAB Ectopic Multiple Live Births               # Outcome Date GA Lbr Len/2nd Weight Sex Delivery Anes PTL Lv  1 Current               Past Medical History:  Diagnosis Date  . Medical history non-contributory     Family History  Problem Relation Age of Onset  . Cancer Neg Hx   . Diabetes Neg Hx   . Hypertension Neg Hx     Social History   Social History  . Marital status: Married    Spouse name: N/A  . Number of children: N/A  . Years of education: N/A   Social History Main Topics  . Smoking status: Never Smoker  . Smokeless tobacco: Never Used  . Alcohol use No  . Drug use: No  . Sexual activity: Yes   Other Topics Concern  . None   Social History Narrative  . None    No Known Allergies  No current facility-administered medications on file prior to encounter.    No current outpatient prescriptions on file prior to encounter.     Review of Systems  Constitutional: Negative.   Eyes: Negative for visual disturbance.  Respiratory: Negative for shortness of breath.   Cardiovascular: Positive for leg swelling (BLE x 3 weeks). Negative for chest pain.  Gastrointestinal: Negative.   Genitourinary: Positive for vaginal discharge (vs LOF). Negative for vaginal bleeding.  Neurological: Negative for dizziness and headaches.     Physical Exam   Vitals:   06/19/16 0951 06/19/16 1021 06/19/16 1041 06/19/16 1051  BP: 143/97 (!) 149/101 133/97 143/100   Pulse: 79 76 82 72  Resp:      Temp:      TempSrc:        Physical Exam  Nursing note and vitals reviewed. Constitutional: She is oriented to person, place, and time. She appears well-developed and well-nourished. No distress.  HENT:  Head: Normocephalic and atraumatic.  Eyes: Conjunctivae are normal. Right eye exhibits no discharge. Left eye exhibits no discharge. No scleral icterus.  Neck: Normal range of motion.  Cardiovascular: Normal rate.   Respiratory: Effort normal. No respiratory distress.  Genitourinary: No bleeding in the vagina.  Genitourinary Comments: + pooling of clear watery fluid  Musculoskeletal: She exhibits edema (BLE 2+).  Neurological: She is alert and oriented to person, place, and time. She has normal reflexes.  No clonus  Skin: Skin is warm and dry. She is not diaphoretic.  Psychiatric: She has a normal mood and affect. Her behavior is normal. Judgment and thought content normal.    MAU Course  Procedures Results for orders placed or performed during the hospital encounter of 06/19/16 (from the past 24 hour(s))  Urinalysis, Routine w reflex microscopic (not at [facility])     Status: Abnormal   Collection Time: 06/19/16  9:24 AM  Result Value Ref Range   Color, Urine YELLOW YELLOW   APPearance CLEAR CLEAR   Specific Gravity, Urine <1.005 (L) 1.005 - 1.030   pH 6.0 5.0 - 8.0   Glucose, UA NEGATIVE NEGATIVE mg/dL   Hgb urine dipstick TRACE (A) NEGATIVE   Bilirubin Urine NEGATIVE NEGATIVE   Ketones, ur NEGATIVE NEGATIVE mg/dL   Protein, ur 959 (A) NEGATIVE mg/dL   Nitrite NEGATIVE NEGATIVE   Leukocytes, UA NEGATIVE NEGATIVE  Urine microscopic-add on     Status: Abnormal   Collection Time: 06/19/16  9:24 AM  Result Value Ref Range   Squamous Epithelial / LPF 0-5 (A) NONE SEEN   WBC, UA 0-5 0 - 5 WBC/hpf   RBC / HPF 0-5 0 - 5 RBC/hpf   Bacteria, UA RARE (A) NONE SEEN  Fern Test     Status: None   Collection Time: 06/19/16 11:19 AM  Result Value Ref  Range   POCT Fern Test Positive = ruptured amniotic membanes     MDM Category 1 tracing Elevated BPs, none severe range. Pt denies symptoms & denies hx of htn Positive for pooling & Fern positive Discussed patient with Dr. Henderson Cloud. Will admit to birthing suites with routine orders & PreE labs.  Assessment and Plan  A: PROM in third trimester Hypertension in pregnancy  P: Admit to birthing suites Routine Labor admission orders with epidural prn GBS results not in prenatal records; negative per patient Hypertension labs ordered   Judeth Horn, NP 06/19/2016 11:16 AM

## 2016-06-19 NOTE — H&P (Signed)
20 y.o. [redacted]w[redacted]d  G1P0 comes in c/o ROM at 0430.  Otherwise has good fetal movement and no bleeding.  Pt has had mild HA but no other symptoms of preeclampsia.  BPS in MAU were elevated in mild range but since on L&D she has had 3 values 160s-170s/90s-100s.  Labs are abnormal for 2+ protein on UA and Cr of 1.03.  Past Medical History:  Diagnosis Date  . Medical history non-contributory     Past Surgical History:  Procedure Laterality Date  . WISDOM TOOTH EXTRACTION      OB History  Gravida Para Term Preterm AB Living  1         0  SAB TAB Ectopic Multiple Live Births               # Outcome Date GA Lbr Len/2nd Weight Sex Delivery Anes PTL Lv  1 Current               Social History   Social History  . Marital status: Married    Spouse name: N/Abbott  . Number of children: N/Abbott  . Years of education: N/Abbott   Occupational History  . Not on file.   Social History Main Topics  . Smoking status: Never Smoker  . Smokeless tobacco: Never Used  . Alcohol use No  . Drug use: No  . Sexual activity: Yes   Other Topics Concern  . Not on file   Social History Narrative  . No narrative on file   Review of patient's allergies indicates no known allergies.    Prenatal Transfer Tool  Maternal Diabetes: No Genetic Screening: Declined Maternal Ultrasounds/Referrals: Normal Fetal Ultrasounds or other Referrals:  None Maternal Substance Abuse:  No Significant Maternal Medications:  None Significant Maternal Lab Results: None  Other PNC: uncomplicated.    Vitals:   06/19/16 1431 06/19/16 1446  BP: (!) 156/88 (!) 161/90  Pulse: 60 67  Resp:  20  Temp:       Lungs/Cor:  NAD Abdomen:  soft, gravid Ex:  no cords, erythema, clonus and reflexes normal SVE:  4/70/-2 FHTs:  140s, good STV, NST R, occ episodes of less long term variability Toco:  q 4-10   Abbott/P   Term SROM with severe preeclampsia.  MgSO4 started and pit augmentation started.    GBS neg.  Holly Abbott

## 2016-06-19 NOTE — Progress Notes (Signed)
Pt comfortable with epidural.  Vitals:   06/19/16 2130 06/19/16 2135 06/19/16 2140 06/19/16 2141  BP: (!) 142/95 (!) 144/95  (!) 151/89  Pulse: 72 88  83  Resp:  18    Temp:  98.2 F (36.8 C)    TempSrc:  Oral    SpO2: 98% 99% 100%   Weight:      Height:       FHTs 120s, good STV, some accels but not strictly reactive over last hour; occ mild variables, cat 2 tracing  Toco q 5  SVE 8/C/-2  FSE and IUPC placed.  Will monitor strip.

## 2016-06-19 NOTE — Anesthesia Procedure Notes (Signed)
Epidural Patient location during procedure: OB Start time: 06/19/2016 9:16 PM  Staffing Anesthesiologist: Mal Amabile Performed: anesthesiologist   Preanesthetic Checklist Completed: patient identified, site marked, surgical consent, pre-op evaluation, timeout performed, IV checked, risks and benefits discussed and monitors and equipment checked  Epidural Patient position: sitting Prep: site prepped and draped and DuraPrep Patient monitoring: continuous pulse ox and blood pressure Approach: midline Location: L4-L5 Injection technique: LOR air  Needle:  Needle type: Tuohy  Needle gauge: 17 G Needle length: 9 cm and 9 Needle insertion depth: 4 cm Catheter type: closed end flexible Catheter size: 19 Gauge Catheter at skin depth: 9 cm Test dose: negative and Other  Assessment Events: blood not aspirated, injection not painful, no injection resistance, negative IV test and no paresthesia  Additional Notes Patient identified. Risks and benefits discussed including failed block, incomplete  Pain control, post dural puncture headache, nerve damage, paralysis, blood pressure Changes, nausea, vomiting, reactions to medications-both toxic and allergic and post Partum back pain. All questions were answered. Patient expressed understanding and wished to proceed. Sterile technique was used throughout procedure. Epidural site was Dressed with sterile barrier dressing. No paresthesias, signs of intravascular injection Or signs of intrathecal spread were encountered.  Patient was more comfortable after the epidural was dosed. Please see RN's note for documentation of vital signs and FHR which are stable.

## 2016-06-19 NOTE — Anesthesia Pain Management Evaluation Note (Signed)
  CRNA Pain Management Visit Note  Patient: Holly Abbott, 20 y.o., female  "Hello I am a member of the anesthesia team at [facility]. We have an anesthesia team available at all times to provide care throughout the hospital, including epidural management and anesthesia for C-section. I don't know your plan for the delivery whether it a natural birth, water birth, IV sedation, nitrous supplementation, doula or epidural, but we want to meet your pain goals."   1.Was your pain managed to your expectations on prior hospitalizations?   No prior hospitalizations  2.What is your expectation for pain management during this hospitalization?     Epidural, IV pain meds and Nitrous Oxide  3.How can we help you reach that goal? Be available as needed  Record the patient's initial score and the patient's pain goal.   Pain: 4  Pain Goal: 6 [facility] wants you to be able to say your pain was always managed very well.  [facility] 06/19/2016

## 2016-06-19 NOTE — Anesthesia Preprocedure Evaluation (Signed)
Anesthesia Evaluation  Patient identified by MRN, date of birth, ID band Patient awake    Reviewed: Allergy & Precautions, Patient's Chart, lab work & pertinent test results  Airway Mallampati: III  TM Distance: >3 FB Neck ROM: Full    Dental no notable dental hx. (+) Teeth Intact   Pulmonary neg pulmonary ROS,    Pulmonary exam normal breath sounds clear to auscultation       Cardiovascular hypertension, Normal cardiovascular exam Rhythm:Regular Rate:Normal     Neuro/Psych negative neurological ROS  negative psych ROS   GI/Hepatic Neg liver ROS, GERD  ,  Endo/Other  Obesity  Renal/GU negative Renal ROS  negative genitourinary   Musculoskeletal   Abdominal (+) + obese,   Peds  Hematology  (+) anemia ,   Anesthesia Other Findings   Reproductive/Obstetrics (+) Pregnancy PPROM                             Anesthesia Physical Anesthesia Plan  ASA: II  Anesthesia Plan: Epidural   Post-op Pain Management:    Induction:   Airway Management Planned: Natural Airway  Additional Equipment:   Intra-op Plan:   Post-operative Plan:   Informed Consent: I have reviewed the patients History and Physical, chart, labs and discussed the procedure including the risks, benefits and alternatives for the proposed anesthesia with the patient or authorized representative who has indicated his/her understanding and acceptance.     Plan Discussed with: Anesthesiologist  Anesthesia Plan Comments:         Anesthesia Quick Evaluation

## 2016-06-19 NOTE — MAU Note (Signed)
Pt states she has been leaking since 0445 this morning, clear fluid, occasional contractions.  Denies bleeding.

## 2016-06-20 ENCOUNTER — Encounter (HOSPITAL_COMMUNITY): Payer: Self-pay

## 2016-06-20 LAB — CBC
HEMATOCRIT: 29.6 % — AB (ref 36.0–46.0)
HEMOGLOBIN: 9.9 g/dL — AB (ref 12.0–15.0)
MCH: 27.7 pg (ref 26.0–34.0)
MCHC: 33.4 g/dL (ref 30.0–36.0)
MCV: 82.7 fL (ref 78.0–100.0)
Platelets: 270 10*3/uL (ref 150–400)
RBC: 3.58 MIL/uL — ABNORMAL LOW (ref 3.87–5.11)
RDW: 16 % — ABNORMAL HIGH (ref 11.5–15.5)
WBC: 22.7 10*3/uL — AB (ref 4.0–10.5)

## 2016-06-20 LAB — RPR: RPR: NONREACTIVE

## 2016-06-20 MED ORDER — TETANUS-DIPHTH-ACELL PERTUSSIS 5-2.5-18.5 LF-MCG/0.5 IM SUSP: 0.5000 mL | Freq: Once | INTRAMUSCULAR | Status: DC

## 2016-06-20 MED ORDER — ONDANSETRON HCL 4 MG/2ML IJ SOLN: 4.0000 mg | INTRAMUSCULAR | Status: DC | PRN

## 2016-06-20 MED ORDER — METHYLERGONOVINE MALEATE 0.2 MG/ML IJ SOLN: 0.2000 mg | INTRAMUSCULAR | Status: DC | PRN

## 2016-06-20 MED ORDER — FERROUS SULFATE 325 (65 FE) MG PO TABS
325.0000 mg | ORAL_TABLET | Freq: Two times a day (BID) | ORAL | Status: DC
  Administered 2016-06-20 – 2016-06-21 (×3): 325 mg via ORAL
  Filled 2016-06-20 (×3): qty 1

## 2016-06-20 MED ORDER — SODIUM CHLORIDE 0.9% FLUSH: 3.0000 mL | Freq: Two times a day (BID) | INTRAVENOUS | Status: DC

## 2016-06-20 MED ORDER — DIPHENHYDRAMINE HCL 25 MG PO CAPS: 25.0000 mg | ORAL_CAPSULE | Freq: Four times a day (QID) | ORAL | Status: DC | PRN

## 2016-06-20 MED ORDER — ONDANSETRON HCL 4 MG PO TABS: 4.0000 mg | ORAL_TABLET | ORAL | Status: DC | PRN

## 2016-06-20 MED ORDER — ZOLPIDEM TARTRATE 5 MG PO TABS: 5.0000 mg | ORAL_TABLET | Freq: Every evening | ORAL | Status: DC | PRN

## 2016-06-20 MED ORDER — COCONUT OIL OIL: 1.0000 "application " | TOPICAL_OIL | Status: DC | PRN

## 2016-06-20 MED ORDER — PRENATAL MULTIVITAMIN CH
1.0000 | ORAL_TABLET | Freq: Every day | ORAL | Status: DC
  Administered 2016-06-20 – 2016-06-21 (×2): 1 via ORAL
  Filled 2016-06-20 (×2): qty 1

## 2016-06-20 MED ORDER — MEASLES, MUMPS & RUBELLA VAC ~~LOC~~ INJ
0.5000 mL | INJECTION | Freq: Once | SUBCUTANEOUS | Status: DC
  Filled 2016-06-20: qty 0.5

## 2016-06-20 MED ORDER — MAGNESIUM SULFATE 50 % IJ SOLN
2.0000 g/h | INTRAMUSCULAR | Status: DC
  Filled 2016-06-20 (×2): qty 80

## 2016-06-20 MED ORDER — MAGNESIUM HYDROXIDE 400 MG/5ML PO SUSP: 30.0000 mL | ORAL | Status: DC | PRN

## 2016-06-20 MED ORDER — SODIUM CHLORIDE 0.9% FLUSH
3.0000 mL | Freq: Two times a day (BID) | INTRAVENOUS | Status: DC
  Administered 2016-06-20: 3 mL via INTRAVENOUS

## 2016-06-20 MED ORDER — BENZOCAINE-MENTHOL 20-0.5 % EX AERO
1.0000 "application " | INHALATION_SPRAY | CUTANEOUS | Status: DC | PRN
  Administered 2016-06-20: 1 via TOPICAL
  Filled 2016-06-20: qty 56

## 2016-06-20 MED ORDER — WITCH HAZEL-GLYCERIN EX PADS: 1.0000 "application " | MEDICATED_PAD | CUTANEOUS | Status: DC | PRN

## 2016-06-20 MED ORDER — METHYLERGONOVINE MALEATE 0.2 MG PO TABS: 0.2000 mg | ORAL_TABLET | ORAL | Status: DC | PRN

## 2016-06-20 MED ORDER — SODIUM CHLORIDE 0.9 % IV SOLN: 250.0000 mL | INTRAVENOUS | Status: DC | PRN

## 2016-06-20 MED ORDER — SODIUM CHLORIDE 0.9% FLUSH: 3.0000 mL | INTRAVENOUS | Status: DC | PRN

## 2016-06-20 MED ORDER — OXYCODONE-ACETAMINOPHEN 5-325 MG PO TABS: 2.0000 | ORAL_TABLET | ORAL | Status: DC | PRN

## 2016-06-20 MED ORDER — DIBUCAINE 1 % RE OINT: 1.0000 "application " | TOPICAL_OINTMENT | RECTAL | Status: DC | PRN

## 2016-06-20 MED ORDER — SENNOSIDES-DOCUSATE SODIUM 8.6-50 MG PO TABS: 2.0000 | ORAL_TABLET | ORAL | Status: DC

## 2016-06-20 MED ORDER — IBUPROFEN 800 MG PO TABS
800.0000 mg | ORAL_TABLET | Freq: Three times a day (TID) | ORAL | Status: DC
  Administered 2016-06-20 – 2016-06-21 (×4): 800 mg via ORAL
  Filled 2016-06-20 (×5): qty 1

## 2016-06-20 MED ORDER — SIMETHICONE 80 MG PO CHEW: 80.0000 mg | CHEWABLE_TABLET | ORAL | Status: DC | PRN

## 2016-06-20 MED ORDER — ACETAMINOPHEN 325 MG PO TABS: 650.0000 mg | ORAL_TABLET | ORAL | Status: DC | PRN

## 2016-06-20 MED ORDER — NIFEDIPINE ER 30 MG PO TB24
30.0000 mg | ORAL_TABLET | Freq: Every day | ORAL | Status: DC
  Administered 2016-06-20 – 2016-06-21 (×2): 30 mg via ORAL
  Filled 2016-06-20 (×3): qty 1

## 2016-06-20 MED ORDER — OXYCODONE-ACETAMINOPHEN 5-325 MG PO TABS: 1.0000 | ORAL_TABLET | ORAL | Status: DC | PRN

## 2016-06-20 NOTE — Progress Notes (Signed)
Post Partum Day 1 Subjective: no complaints, up ad lib, voiding, tolerating PO, + flatus and No HA, no blurry vision, no RUQ pain  Objective: Blood pressure (!) 142/92, pulse 96, temperature 99.2 F (37.3 C), temperature source Oral, resp. rate 18, height 5\' 5"  (1.651 m), weight 79.4 kg (175 lb), last menstrual period 09/28/2015, SpO2 100 %, unknown if currently breastfeeding.  Physical Exam:  General: alert, cooperative and no distress Lochia: appropriate Uterine Fundus: firm perineum: healing well, no significant drainage DVT Evaluation: No evidence of DVT seen on physical exam. Negative Homan's sign. No cords or calf tenderness. DTR: 2+  Recent Labs  06/19/16 2021 06/20/16 0556  HGB 10.8* 9.9*  HCT 32.4* 29.6*    Assessment/Plan: S/p SVD severe preeclampsia no sx today Will stop magnesium sulfate Procardia XL 30 mg daily Patient given PreE precautions Baby too small for circ today Breastfeeding Will consider D/C tomorrow   LOS: 1 day   Bree Heinzelman STACIA 06/20/2016, 9:24 AM

## 2016-06-20 NOTE — Lactation Note (Signed)
This note was copied from a baby's chart. Lactation Consultation Note  Patient Name: Holly Abbott [id number] Date: 06/20/2016 Reason for consult: Initial assessment Initial visit made.  Breastfeeding consultation services and support information given and reviewed.  Baby is 10 hours old.  This is mom's first baby.  She states she has been able to latch baby to one side only.  Reassured and instructed to call for feeding assist prn.  Instructed on feeding cues.  Mom eating breakfast now so will follow up later.  Maternal Data Does the patient have breastfeeding experience prior to this delivery?: No  Feeding Feeding Type: Breast Fed  LATCH Score/Interventions Latch: Repeated attempts needed to sustain latch, nipple held in mouth throughout feeding, stimulation needed to elicit sucking reflex. Intervention(s): Adjust position;Assist with latch;Breast massage;Breast compression  Audible Swallowing: None Intervention(s): Skin to skin Intervention(s): Skin to skin  Type of Nipple: Everted at rest and after stimulation  Comfort (Breast/Nipple): Soft / non-tender     Hold (Positioning): Assistance needed to correctly position infant at breast and maintain latch. Intervention(s): Breastfeeding basics reviewed;Support Pillows;Position options;Skin to skin  LATCH Score: 6  Lactation Tools Discussed/Used     Consult Status Consult Status: Follow-up Date: 06/21/16 Follow-up type: In-patient    Huston Foley 06/20/2016, 10:17 AM

## 2016-06-20 NOTE — Anesthesia Postprocedure Evaluation (Signed)
Anesthesia Post Note  Patient: Holly Abbott  Procedure(s) Performed: * No procedures listed *  Patient location during evaluation: Mother Baby Anesthesia Type: Epidural Level of consciousness: awake and alert and oriented Pain management: satisfactory to patient Vital Signs Assessment: post-procedure vital signs reviewed and stable Respiratory status: spontaneous breathing and nonlabored ventilation Cardiovascular status: stable Postop Assessment: no headache, no backache, no signs of nausea or vomiting, adequate PO intake and patient able to bend at knees (patient up walking) Anesthetic complications: no     Last Vitals:  Vitals:   06/20/16 0500 06/20/16 0517  BP:  (!) 148/98  Pulse:  90  Resp: 18 18  Temp:  37 C    Last Pain:  Vitals:   06/20/16 0517  TempSrc: Oral  PainSc:    Pain Goal: Patients Stated Pain Goal: 4 (06/20/16 0500)               Madison Hickman

## 2016-06-20 NOTE — Lactation Note (Signed)
This note was copied from a baby's chart. Lactation Consultation Note  Patient Name: Holly Abbott JOINO'M Date: 06/20/2016 Reason for consult: Follow-up assessment Assisted with latch.  Baby positioned baby skin to skin in football hold.   Instructed on hand expression and good flow of colostrum noted.  Baby latched after a few attempts and nursed actively.  Baby did need to be relatched often.  Baby finished first breast and then latched to opposite breast.  Basic teaching done and questions answered.  Encouraged to call for assist prn.  Maternal Data    Feeding Feeding Type: Breast Fed Length of feed: 15 min  LATCH Score/Interventions Latch: Repeated attempts needed to sustain latch, nipple held in mouth throughout feeding, stimulation needed to elicit sucking reflex. Intervention(s): Adjust position;Assist with latch;Breast massage;Breast compression  Audible Swallowing: A few with stimulation Intervention(s): Hand expression;Skin to skin Intervention(s): Alternate breast massage  Type of Nipple: Everted at rest and after stimulation  Comfort (Breast/Nipple): Soft / non-tender     Hold (Positioning): Assistance needed to correctly position infant at breast and maintain latch. Intervention(s): Breastfeeding basics reviewed;Support Pillows;Position options;Skin to skin  LATCH Score: 7  Lactation Tools Discussed/Used     Consult Status Consult Status: Follow-up Date: 06/21/16 Follow-up type: In-patient    Huston Foley 06/20/2016, 3:06 PM

## 2016-06-21 MED ORDER — ACETAMINOPHEN 325 MG PO TABS: 325.0000 mg | ORAL_TABLET | ORAL | 2 refills | Status: DC | PRN

## 2016-06-21 MED ORDER — IBUPROFEN 800 MG PO TABS: 800.0000 mg | ORAL_TABLET | Freq: Three times a day (TID) | ORAL | 3 refills | Status: DC | PRN

## 2016-06-21 MED ORDER — NIFEDIPINE ER 30 MG PO TB24: 30.0000 mg | ORAL_TABLET | Freq: Every day | ORAL | 3 refills | Status: DC

## 2016-06-21 MED ORDER — PRENATAL MULTIVITAMIN CH: 1.0000 | ORAL_TABLET | Freq: Every day | ORAL | 3 refills | Status: DC

## 2016-06-21 MED ORDER — FERROUS SULFATE 325 (65 FE) MG PO TABS: 325.0000 mg | ORAL_TABLET | Freq: Two times a day (BID) | ORAL | 3 refills | Status: DC

## 2016-06-21 NOTE — Discharge Summary (Signed)
Obstetric Discharge Summary Reason for Admission: onset of labor and preeclampsia Prenatal Procedures: NST and ultrasound Intrapartum Procedures: spontaneous vaginal delivery Postpartum Procedures: IV magnesium sulfate Complications-Operative and Postpartum: none Hemoglobin  Date Value Ref Range Status  06/20/2016 9.9 (L) 12.0 - 15.0 g/dL Final   HCT  Date Value Ref Range Status  06/20/2016 29.6 (L) 36.0 - 46.0 % Final    Physical Exam:  General: alert, cooperative and no distress Lochia: appropriate Uterine Fundus: firm perineum: healing well, no significant drainage, no dehiscence DVT Evaluation: No evidence of DVT seen on physical exam. Negative Homan's sign. No cords or calf tenderness. No significant calf/ankle edema.  Discharge Diagnoses: Term Pregnancy-delivered and Preelampsia  Discharge Information: Date: 06/21/2016 Activity: pelvic rest Diet: routine Medications: PNV, Ibuprofen and Iron and procardia Condition: stable Instructions: refer to practice specific booklet Discharge to: home   Newborn Data: Live born female  Birth Weight: 6 lb 2.9 oz (2805 g) APGAR: 7, 9  Home with mother.  Essie Hart STACIA 06/21/2016, 9:24 AM

## 2016-06-21 NOTE — Lactation Note (Signed)
This note was copied from a baby's chart. Lactation Consultation Note: Follow up visit. Mom reports baby just fed about 1 hour ago. He is asleep at mom's side. Reports RN assisted her with deeper latch and keeping him awake for feedings. Reviewed wide open mouth and getting the baby deep onto the breast. Reports no pain with the deeper latch. Reviewed our phone number to call with questions/concerns. Reviewed [facility] and OP appointments as resources after DC. Plans to call insurance company about a pump.   Patient Name: Holly Abbott'B Date: 06/21/2016 Reason for consult: Follow-up assessment   Maternal Data Formula Feeding for Exclusion: No Has patient been taught Hand Expression?: Yes Does the patient have breastfeeding experience prior to this delivery?: No  Feeding   LATCH Score/Interventions  Lactation Tools Discussed/Used     Consult Status Consult Status: Complete    Pamelia Hoit 06/21/2016, 9:15 AM

## 2016-06-21 NOTE — Progress Notes (Signed)
Discharge instructions reviewed with patient.  Patient states understanding of home care for herself and baby, medications, activity, signs/symptoms to report to MD and return MD office visit.  Patients significant other and family will assist with her care @ home.  No home  equipment needed, patient has prescriptions and all personal belongings.  Patient discharged via wheelchair in stable condition with staff without incident.

## 2016-06-21 NOTE — Progress Notes (Signed)
Post Partum Day 2 Subjective: no complaints, up ad lib, voiding, tolerating PO, + flatus and No HA, no blurring vision, no RUQ pain  Objective: Blood pressure 126/80, pulse 74, temperature 97.9 F (36.6 C), temperature source Oral, resp. rate 18, height 5\' 5"  (1.651 m), weight 79.2 kg (174 lb 8 oz), last menstrual period 09/28/2015, SpO2 98 %, unknown if currently breastfeeding.  Physical Exam:  General: alert, cooperative and no distress Lochia: appropriate Uterine Fundus: firm perineum: healing well, no significant drainage, no dehiscence DVT Evaluation: No evidence of DVT seen on physical exam. Negative Homan's sign. No cords or calf tenderness. No significant calf/ankle edema.   Recent Labs  06/19/16 2021 06/20/16 0556  HGB 10.8* 9.9*  HCT 32.4* 29.6*    Assessment/Plan: Discharge home and Breastfeeding  Will have patient f/u in office this week for BP check Pre E precautions.    LOS: 2 days   Advith Martine STACIA 06/21/2016, 9:20 AM

## 2017-01-07 ENCOUNTER — Encounter (HOSPITAL_COMMUNITY): Payer: Self-pay | Admitting: Emergency Medicine

## 2017-01-07 ENCOUNTER — Ambulatory Visit (HOSPITAL_COMMUNITY)
Admission: EM | Admit: 2017-01-07 | Discharge: 2017-01-07 | Disposition: A | Discharge status: Home / Self Care | Payer: BLUE CROSS/BLUE SHIELD | Attending: Family Medicine | Admitting: Family Medicine

## 2017-01-07 DIAGNOSIS — M791 Myalgia, unspecified site: Secondary | ICD-10-CM

## 2017-01-07 DIAGNOSIS — E860 Dehydration: Secondary | ICD-10-CM

## 2017-01-07 DIAGNOSIS — Z3202 Encounter for pregnancy test, result negative: Secondary | ICD-10-CM

## 2017-01-07 DIAGNOSIS — E876 Hypokalemia: Secondary | ICD-10-CM

## 2017-01-07 LAB — POCT I-STAT, CHEM 8
BUN: 7 mg/dL (ref 6–20)
CALCIUM ION: 1.2 mmol/L (ref 1.15–1.40)
CHLORIDE: 102 mmol/L (ref 101–111)
Creatinine, Ser: 0.8 mg/dL (ref 0.44–1.00)
Glucose, Bld: 96 mg/dL (ref 65–99)
HCT: 37 % (ref 36.0–46.0)
HEMOGLOBIN: 12.6 g/dL (ref 12.0–15.0)
Potassium: 3.4 mmol/L — ABNORMAL LOW (ref 3.5–5.1)
Sodium: 142 mmol/L (ref 135–145)
TCO2: 28 mmol/L (ref 0–100)

## 2017-01-07 LAB — POCT URINALYSIS DIP (DEVICE)
Bilirubin Urine: NEGATIVE
Glucose, UA: NEGATIVE mg/dL
HGB URINE DIPSTICK: NEGATIVE
KETONES UR: NEGATIVE mg/dL
Leukocytes, UA: NEGATIVE
NITRITE: NEGATIVE
PH: 6 (ref 5.0–8.0)
PROTEIN: NEGATIVE mg/dL
Specific Gravity, Urine: >=1.03 (ref 1.005–1.030)
Urobilinogen, UA: 0.2 mg/dL (ref 0.0–1.0)

## 2017-01-07 LAB — POCT PREGNANCY, URINE: PREG TEST UR: NEGATIVE

## 2017-01-07 MED ORDER — POTASSIUM CHLORIDE CRYS ER 20 MEQ PO TBCR: 20.0000 meq | EXTENDED_RELEASE_TABLET | Freq: Two times a day (BID) | ORAL | 0 refills | Status: DC

## 2017-01-07 MED ORDER — POTASSIUM CHLORIDE CRYS ER 20 MEQ PO TBCR
EXTENDED_RELEASE_TABLET | ORAL | Status: AC
  Filled 2017-01-07: qty 1

## 2017-01-07 MED ORDER — POTASSIUM CHLORIDE CRYS ER 20 MEQ PO TBCR
20.0000 meq | EXTENDED_RELEASE_TABLET | Freq: Once | ORAL | Status: AC
  Administered 2017-01-07: 20 meq via ORAL

## 2017-01-07 NOTE — ED Provider Notes (Signed)
CSN: [id number]     Arrival date & time 01/07/17  1318 History   First MD Initiated Contact with Patient 01/07/17 1410     Chief Complaint  Patient presents with  . Abdominal Cramping   (Consider location/radiation/quality/duration/timing/severity/associated sxs/prior Treatment) 21 year old female presents to the urgent care with complaints of lightheadedness, nausea particular he after eating and achy legs. She states she is also concerned about the possibility of being pregnant. LMP was January 15 today is February 14. She states she is usually right on time with her menses. Denies urinary symptoms. Denies vomiting. SHe states otherwise  feels well.      Past Medical History:  Diagnosis Date  . Scoliosis    Pt known as a child; MD @ bedside when notified    Past Surgical History:  Procedure Laterality Date  . WISDOM TOOTH EXTRACTION     Family History  Problem Relation Age of Onset  . Cancer Neg Hx   . Diabetes Neg Hx   . Hypertension Neg Hx    Social History  Substance Use Topics  . Smoking status: Never Smoker  . Smokeless tobacco: Never Used  . Alcohol use No   OB History    Gravida Para Term Preterm AB Living   1 1 1     1    SAB TAB Ectopic Multiple Live Births         0 1     Review of Systems  Constitutional: Negative for fever.  HENT: Negative.   Respiratory: Negative.  Negative for cough and shortness of breath.   Cardiovascular: Negative.   Gastrointestinal: Positive for nausea. Negative for abdominal pain and vomiting.  Genitourinary: Negative for dysuria, frequency, pelvic pain, urgency and vaginal discharge.  Musculoskeletal: Positive for myalgias.  Skin: Negative.   Neurological: Positive for light-headedness.    Allergies  Patient has no known allergies.  Home Medications   Prior to Admission medications   Medication Sig Start Date End Date Taking? Authorizing Provider  potassium chloride SA (K-DUR,KLOR-CON) 20 MEQ tablet Take 1 tablet (20  mEq total) by mouth 2 (two) times daily. 01/07/17   Hayden Rasmussen, NP   Meds Ordered and Administered this Visit   Medications  potassium chloride SA (K-DUR,KLOR-CON) CR tablet 20 mEq (not administered)    BP (!) 99/53 (BP Location: Right Arm)   Pulse 87   Temp 98.7 F (37.1 C) (Oral)   Resp 16   LMP 12/08/2016 (Exact Date)   SpO2 100%   Breastfeeding? Yes  No data found.   Physical Exam  Constitutional: She is oriented to person, place, and time. She appears well-developed and well-nourished. No distress.  HENT:  Head: Normocephalic and atraumatic.  Mouth/Throat: No oropharyngeal exudate.  Bilateral TMs are normal. Oropharynx with cobblestoning, minor erythema and clear PND otherwise normal.  Eyes: Conjunctivae and EOM are normal. Pupils are equal, round, and reactive to light.  Neck: Normal range of motion. Neck supple. No tracheal deviation present.  Cardiovascular: Normal rate, regular rhythm, normal heart sounds and intact distal pulses.   Pulmonary/Chest: Effort normal and breath sounds normal. No respiratory distress. She has no wheezes.  Abdominal: Soft. Bowel sounds are normal. She exhibits no mass. There is no tenderness. There is no rebound and no guarding. No hernia.  Musculoskeletal: Normal range of motion. She exhibits no edema or tenderness.  Lymphadenopathy:    She has no cervical adenopathy.  Neurological: She is alert and oriented to person, place, and time. No cranial nerve  deficit. She exhibits normal muscle tone. Coordination normal.  Skin: Skin is warm and dry. No rash noted.  Psychiatric: She has a normal mood and affect.  Nursing note and vitals reviewed.   Urgent Care Course     Procedures (including critical care time)  Labs Review Labs Reviewed  POCT I-STAT, CHEM 8 - Abnormal; Notable for the following:       Result Value   Potassium 3.4 (*)    All other components within normal limits  POCT URINALYSIS DIP (DEVICE)  POCT PREGNANCY, URINE    Results for orders placed or performed during the hospital encounter of 01/07/17  POCT urinalysis dip (device)  Result Value Ref Range   Glucose, UA NEGATIVE NEGATIVE mg/dL   Bilirubin Urine NEGATIVE NEGATIVE   Ketones, ur NEGATIVE NEGATIVE mg/dL   Specific Gravity, Urine >=1.030 1.005 - 1.030   Hgb urine dipstick NEGATIVE NEGATIVE   pH 6.0 5.0 - 8.0   Protein, ur NEGATIVE NEGATIVE mg/dL   Urobilinogen, UA 0.2 0.0 - 1.0 mg/dL   Nitrite NEGATIVE NEGATIVE   Leukocytes, UA NEGATIVE NEGATIVE  Pregnancy, urine POC  Result Value Ref Range   Preg Test, Ur NEGATIVE NEGATIVE  I-STAT, chem 8  Result Value Ref Range   Sodium 142 135 - 145 mmol/L   Potassium 3.4 (L) 3.5 - 5.1 mmol/L   Chloride 102 101 - 111 mmol/L   BUN 7 6 - 20 mg/dL   Creatinine, Ser 1.610.80 0.44 - 1.00 mg/dL   Glucose, Bld 96 65 - 99 mg/dL   Calcium, Ion 0.961.20 1.15 - 1.40 mmol/L   TCO2 28 0 - 100 mmol/L   Hemoglobin 12.6 12.0 - 15.0 g/dL   HCT 04.537.0 40.936.0 - 81.146.0 %     Imaging Review No results found.   Visual Acuity Review  Right Eye Distance:   Left Eye Distance:   Bilateral Distance:    Right Eye Near:   Left Eye Near:    Bilateral Near:         MDM   1. Dehydration, mild   2. Hypokalemia   3. Myalgia    It looks like that most of her symptoms are coming from dehydration. Your potassium is a little low too. He has been offered IV fluids here versus going home and drinking fluids. Your chosen to go home and drink fluids. Recommend that she drink at least 2 quarts of Pedialyte in the next couple of days. Take the potassium as directed. For any worsening, new symptoms or problems seek medical attention. Recommended to follow up with her primary care doctor on Friday if possible. If you do not have your. In the next 2 or 3 days perform a home pregnancy test. The test today was negative. Meds ordered this encounter  Medications  . potassium chloride SA (K-DUR,KLOR-CON) 20 MEQ tablet    Sig: Take 1 tablet  (20 mEq total) by mouth 2 (two) times daily.    Dispense:  8 tablet    Refill:  0    Order Specific Question:   Supervising Provider    Answer:   Eustace MooreMURRAY, LAURA W [[id number]]  . potassium chloride SA (K-DUR,KLOR-CON) CR tablet 20 mEq       Hayden Rasmussenavid Shatisha Falter, NP 01/07/17 1515

## 2017-01-07 NOTE — ED Triage Notes (Signed)
[facility] a [facility] of abdominal cramping and lower leg pain. The patient stated that she is late on her mensis and has requested a pregnancy test.

## 2017-01-07 NOTE — Discharge Instructions (Signed)
It looks like that most of her symptoms are coming from dehydration. Your potassium is a little low too. He has been offered IV fluids here versus going home and drinking fluids. Your chosen to go home and drink fluids. Recommend that she drink at least 2 quarts of Pedialyte in the next couple of days. Take the potassium as directed. For any worsening, new symptoms or problems seek medical attention. Recommended to follow up with her primary care doctor on Friday if possible. If you do not have your. In the next 2 or 3 days perform a home pregnancy test. The test today was negative.

## 2017-01-07 NOTE — ED Notes (Signed)
Bp  98/66    Pulse    74  Flat         bp  122/73   Pulse   81     Sitting      bp   98/66  Pulse   99    Standing

## 2018-04-17 ENCOUNTER — Ambulatory Visit (HOSPITAL_COMMUNITY)
Admission: EM | Admit: 2018-04-17 | Discharge: 2018-04-17 | Disposition: A | Discharge status: Home / Self Care | Payer: 59 | Attending: Family Medicine | Admitting: Family Medicine

## 2018-04-17 ENCOUNTER — Encounter (HOSPITAL_COMMUNITY): Payer: Self-pay | Admitting: Emergency Medicine

## 2018-04-17 DIAGNOSIS — R5383 Other fatigue: Secondary | ICD-10-CM | POA: Diagnosis not present

## 2018-04-17 DIAGNOSIS — Z809 Family history of malignant neoplasm, unspecified: Secondary | ICD-10-CM | POA: Diagnosis not present

## 2018-04-17 DIAGNOSIS — R109 Unspecified abdominal pain: Secondary | ICD-10-CM | POA: Diagnosis present

## 2018-04-17 DIAGNOSIS — Z3202 Encounter for pregnancy test, result negative: Secondary | ICD-10-CM | POA: Diagnosis not present

## 2018-04-17 DIAGNOSIS — Z8249 Family history of ischemic heart disease and other diseases of the circulatory system: Secondary | ICD-10-CM | POA: Insufficient documentation

## 2018-04-17 DIAGNOSIS — M545 Low back pain: Secondary | ICD-10-CM | POA: Diagnosis not present

## 2018-04-17 DIAGNOSIS — M419 Scoliosis, unspecified: Secondary | ICD-10-CM | POA: Diagnosis not present

## 2018-04-17 DIAGNOSIS — D649 Anemia, unspecified: Secondary | ICD-10-CM | POA: Diagnosis not present

## 2018-04-17 DIAGNOSIS — Z833 Family history of diabetes mellitus: Secondary | ICD-10-CM | POA: Diagnosis not present

## 2018-04-17 LAB — COMPREHENSIVE METABOLIC PANEL
ALT: 20 U/L (ref 14–54)
AST: 25 U/L (ref 15–41)
Albumin: 3.4 g/dL — ABNORMAL LOW (ref 3.5–5.0)
Alkaline Phosphatase: 41 U/L (ref 38–126)
Anion gap: 6 (ref 5–15)
BUN: 8 mg/dL (ref 6–20)
CO2: 26 mmol/L (ref 22–32)
Calcium: 8.6 mg/dL — ABNORMAL LOW (ref 8.9–10.3)
Chloride: 102 mmol/L (ref 101–111)
Creatinine, Ser: 1 mg/dL (ref 0.44–1.00)
GFR calc Af Amer: >60 mL/min (ref >60)
GFR calc non Af Amer: >60 mL/min (ref >60)
Glucose, Bld: 95 mg/dL (ref 65–99)
Potassium: 3.8 mmol/L (ref 3.5–5.1)
Sodium: 134 mmol/L — ABNORMAL LOW (ref 135–145)
Total Bilirubin: 0.5 mg/dL (ref 0.3–1.2)
Total Protein: 7.3 g/dL (ref 6.5–8.1)

## 2018-04-17 LAB — POCT URINALYSIS DIP (DEVICE)
BILIRUBIN URINE: NEGATIVE
GLUCOSE, UA: NEGATIVE mg/dL
Hgb urine dipstick: NEGATIVE
Ketones, ur: NEGATIVE mg/dL
LEUKOCYTES UA: NEGATIVE
NITRITE: POSITIVE — AB
PH: 6 (ref 5.0–8.0)
Protein, ur: NEGATIVE mg/dL
Specific Gravity, Urine: 1.02 (ref 1.005–1.030)
Urobilinogen, UA: 0.2 mg/dL (ref 0.0–1.0)

## 2018-04-17 LAB — CBC WITH DIFFERENTIAL/PLATELET
Abs Immature Granulocytes: 0 10*3/uL (ref 0.0–0.1)
Basophils Absolute: 0 10*3/uL (ref 0.0–0.1)
Basophils Relative: 0 %
Eosinophils Absolute: 0.2 10*3/uL (ref 0.0–0.7)
Eosinophils Relative: 3 %
HCT: 36.1 % (ref 36.0–46.0)
Hemoglobin: 11.6 g/dL — ABNORMAL LOW (ref 12.0–15.0)
Immature Granulocytes: 0 %
Lymphocytes Relative: 27 %
Lymphs Abs: 2.2 10*3/uL (ref 0.7–4.0)
MCH: 29.7 pg (ref 26.0–34.0)
MCHC: 32.1 g/dL (ref 30.0–36.0)
MCV: 92.3 fL (ref 78.0–100.0)
Monocytes Absolute: 0.6 10*3/uL (ref 0.1–1.0)
Monocytes Relative: 8 %
Neutro Abs: 5.1 10*3/uL (ref 1.7–7.7)
Neutrophils Relative %: 62 %
Platelets: 320 10*3/uL (ref 150–400)
RBC: 3.91 MIL/uL (ref 3.87–5.11)
RDW: 13.2 % (ref 11.5–15.5)
WBC: 8.3 10*3/uL (ref 4.0–10.5)

## 2018-04-17 LAB — POCT PREGNANCY, URINE: PREG TEST UR: NEGATIVE

## 2018-04-17 NOTE — Discharge Instructions (Addendum)
The location of your pain and association of the recent change in contraception suggests that the birth control pills may be responsible. Therefore I'm recommending that you stop the birth control pills until we have a better understanding of what is going on with your liver.  We are also testing your hemoglobin to see what the status of your anemia is. You should get a call back tomorrow  In the meantime, no medicines are being ordered and I would recommend a low-fat diet for the next 24 hours

## 2018-04-17 NOTE — ED Triage Notes (Signed)
Pt here for right lower back pain and requesting her iron to be checked; pt sts some fatigue also

## 2018-04-17 NOTE — ED Provider Notes (Signed)
[facility]   [id number] 04/17/18 Arrival Time: 1847   SUBJECTIVE:  Holly Abbott is a 22 y.o. female who presents to the urgent care with complaint of right lower back pain and requesting her iron to be checked; pt sts some fatigue also  Patient recently started a new form of birth control. When she first started that, she had nausea for several days.  Her new abdominal pain and right flank painbegan yesterday when she was sitting at her desk. She's had no trauma or unusual change in diet. The pain has continued along the lower right rib cage and has spread anteriorly to the area usually associated with gallbladder disease.  Patient has a history of anemia as well.  Past Medical History:  Diagnosis Date  . Scoliosis    Pt known as a child; MD @ bedside when notified    Family History  Problem Relation Age of Onset  . Cancer Neg Hx   . Diabetes Neg Hx   . Hypertension Neg Hx    Social History   Socioeconomic History  . Marital status: Married    Spouse name: Not on file  . Number of children: Not on file  . Years of education: Not on file  . Highest education level: Not on file  Occupational History  . Not on file  Social Needs  . Financial resource strain: Not on file  . Food insecurity:    Worry: Not on file    Inability: Not on file  . Transportation needs:    Medical: Not on file    Non-medical: Not on file  Tobacco Use  . Smoking status: Never Smoker  . Smokeless tobacco: Never Used  Substance and Sexual Activity  . Alcohol use: No  . Drug use: No  . Sexual activity: Yes  Lifestyle  . Physical activity:    Days per week: Not on file    Minutes per session: Not on file  . Stress: Not on file  Relationships  . Social connections:    Talks on phone: Not on file    Gets together: Not on file    Attends religious service: Not on file    Active member of club or organization: Not on file    Attends meetings of clubs or organizations: Not on file     Relationship status: Not on file  . Intimate partner violence:    Fear of current or ex partner: Not on file    Emotionally abused: Not on file    Physically abused: Not on file    Forced sexual activity: Not on file  Other Topics Concern  . Not on file  Social History Narrative  . Not on file   No outpatient medications have been marked as taking for the 04/17/18 encounter [facility] Encounter).   No Known Allergies    ROS: As per HPI, remainder of ROS negative.   OBJECTIVE:   Vitals:   04/17/18 1931  BP: 110/70  Pulse: (!) 113  Resp: 18  Temp: 98.9 F (37.2 C)  TempSrc: Oral  SpO2: 100%     General appearance: alert; no distress Eyes: PERRL; EOMI; conjunctiva normal HENT: normocephalic; atraumatic;oral mucosa normal Neck: supple Lungs: clear to auscultation bilaterally Heart: regular rate and rhythm Abdomen: soft, tender right upper quadrant; bowel sounds normal; no masses or organomegaly; no guarding or rebound tenderness Back: no CVA tenderness Extremities: no cyanosis or edema; symmetrical with no gross deformities Skin: warm and dry Neurologic: normal gait;  grossly normal Psychological: alert and cooperative; normal mood and affect      Labs:  Results for orders placed or performed during the hospital encounter of 04/17/18  POCT urinalysis dip (device)  Result Value Ref Range   Glucose, UA NEGATIVE NEGATIVE mg/dL   Bilirubin Urine NEGATIVE NEGATIVE   Ketones, ur NEGATIVE NEGATIVE mg/dL   Specific Gravity, Urine 1.020 1.005 - 1.030   Hgb urine dipstick NEGATIVE NEGATIVE   pH 6.0 5.0 - 8.0   Protein, ur NEGATIVE NEGATIVE mg/dL   Urobilinogen, UA 0.2 0.0 - 1.0 mg/dL   Nitrite POSITIVE (A) NEGATIVE   Leukocytes, UA NEGATIVE NEGATIVE  Pregnancy, urine POC  Result Value Ref Range   Preg Test, Ur NEGATIVE NEGATIVE    Labs Reviewed  POCT URINALYSIS DIP (DEVICE) - Abnormal; Notable for the following components:      Result Value   Nitrite  POSITIVE (*)    All other components within normal limits  COMPREHENSIVE METABOLIC PANEL  CBC WITH DIFFERENTIAL/PLATELET  POCT PREGNANCY, URINE    No results found.     ASSESSMENT & PLAN:  1. Right flank pain   The location of your pain and association of the recent change in contraception suggests that the birth control pills may be responsible. Therefore I'm recommending that you stop the birth control pills until we have a better understanding of what is going on with your liver.  We are also testing your hemoglobin to see what the status of your anemia is. You should get a call back tomorrow  In the meantime, no medicines are being ordered and I would recommend a low-fat diet for the next 24 hours  No orders of the defined types were placed in this encounter.   Reviewed expectations re: course of current medical issues. Questions answered. Outlined signs and symptoms indicating need for more acute intervention. Patient verbalized understanding. After Visit Summary given.    Procedures:      Elvina Sidle, MD 04/17/18 2003

## 2018-04-18 ENCOUNTER — Telehealth (HOSPITAL_COMMUNITY): Payer: Self-pay | Admitting: Emergency Medicine

## 2018-04-18 MED ORDER — CEPHALEXIN 500 MG PO CAPS: 500.0000 mg | ORAL_CAPSULE | Freq: Three times a day (TID) | ORAL | 0 refills | Status: DC

## 2018-04-20 ENCOUNTER — Telehealth (HOSPITAL_COMMUNITY): Payer: Self-pay

## 2018-04-20 NOTE — Telephone Encounter (Signed)
Pt called requesting results of pregnancy test because she has not had a period.  Made aware of result being negative and encouraged follow up with OBGYN.

## 2018-04-26 ENCOUNTER — Emergency Department (HOSPITAL_COMMUNITY): Payer: 59

## 2018-04-26 ENCOUNTER — Emergency Department (HOSPITAL_COMMUNITY)
Admission: EM | Admit: 2018-04-26 | Discharge: 2018-04-26 | Disposition: A | Discharge status: Home / Self Care | Payer: 59 | Attending: Emergency Medicine | Admitting: Emergency Medicine

## 2018-04-26 ENCOUNTER — Emergency Department (HOSPITAL_COMMUNITY): Admission: EM | Admit: 2018-04-26 | Discharge: 2018-04-26 | Discharge status: Against Medical Advice | Payer: 59

## 2018-04-26 ENCOUNTER — Encounter (HOSPITAL_COMMUNITY): Payer: Self-pay | Admitting: Emergency Medicine

## 2018-04-26 DIAGNOSIS — R109 Unspecified abdominal pain: Secondary | ICD-10-CM

## 2018-04-26 DIAGNOSIS — J181 Lobar pneumonia, unspecified organism: Secondary | ICD-10-CM | POA: Insufficient documentation

## 2018-04-26 DIAGNOSIS — J189 Pneumonia, unspecified organism: Secondary | ICD-10-CM

## 2018-04-26 DIAGNOSIS — M545 Low back pain: Secondary | ICD-10-CM | POA: Diagnosis not present

## 2018-04-26 LAB — URINALYSIS, ROUTINE W REFLEX MICROSCOPIC
Bilirubin Urine: NEGATIVE
GLUCOSE, UA: NEGATIVE mg/dL
HGB URINE DIPSTICK: NEGATIVE
Ketones, ur: NEGATIVE mg/dL
Leukocytes, UA: NEGATIVE
Nitrite: NEGATIVE
Protein, ur: NEGATIVE mg/dL
SPECIFIC GRAVITY, URINE: 1.005 (ref 1.005–1.030)
pH: 6 (ref 5.0–8.0)

## 2018-04-26 LAB — I-STAT BETA HCG BLOOD, ED (MC, WL, AP ONLY)

## 2018-04-26 LAB — COMPREHENSIVE METABOLIC PANEL
ALT: 16 U/L (ref 14–54)
AST: 19 U/L (ref 15–41)
Albumin: 3.6 g/dL (ref 3.5–5.0)
Alkaline Phosphatase: 46 U/L (ref 38–126)
Anion gap: 8 (ref 5–15)
BUN: 11 mg/dL (ref 6–20)
CHLORIDE: 106 mmol/L (ref 101–111)
CO2: 24 mmol/L (ref 22–32)
CREATININE: 0.91 mg/dL (ref 0.44–1.00)
Calcium: 8.6 mg/dL — ABNORMAL LOW (ref 8.9–10.3)
Glucose, Bld: 92 mg/dL (ref 65–99)
POTASSIUM: 3.6 mmol/L (ref 3.5–5.1)
Sodium: 138 mmol/L (ref 135–145)
Total Bilirubin: 0.4 mg/dL (ref 0.3–1.2)
Total Protein: 7.6 g/dL (ref 6.5–8.1)

## 2018-04-26 LAB — CBC WITH DIFFERENTIAL/PLATELET
Basophils Absolute: 0 10*3/uL (ref 0.0–0.1)
Basophils Relative: 0 %
EOS PCT: 2 %
Eosinophils Absolute: 0.1 10*3/uL (ref 0.0–0.7)
HCT: 36.7 % (ref 36.0–46.0)
Hemoglobin: 11.9 g/dL — ABNORMAL LOW (ref 12.0–15.0)
LYMPHS ABS: 1.2 10*3/uL (ref 0.7–4.0)
LYMPHS PCT: 20 %
MCH: 29.6 pg (ref 26.0–34.0)
MCHC: 32.4 g/dL (ref 30.0–36.0)
MCV: 91.3 fL (ref 78.0–100.0)
MONO ABS: 0.5 10*3/uL (ref 0.1–1.0)
Monocytes Relative: 8 %
Neutro Abs: 4.2 10*3/uL (ref 1.7–7.7)
Neutrophils Relative %: 70 %
Platelets: 321 10*3/uL (ref 150–400)
RBC: 4.02 MIL/uL (ref 3.87–5.11)
RDW: 13 % (ref 11.5–15.5)
WBC: 6.1 10*3/uL (ref 4.0–10.5)

## 2018-04-26 LAB — LIPASE, BLOOD: LIPASE: 32 U/L (ref 11–51)

## 2018-04-26 MED ORDER — IOPAMIDOL (ISOVUE-300) INJECTION 61%
100.0000 mL | Freq: Once | INTRAVENOUS | Status: AC | PRN
  Administered 2018-04-26: 100 mL via INTRAVENOUS

## 2018-04-26 MED ORDER — KETOROLAC TROMETHAMINE 30 MG/ML IJ SOLN
30.0000 mg | Freq: Once | INTRAMUSCULAR | Status: AC
  Administered 2018-04-26: 30 mg via INTRAVENOUS
  Filled 2018-04-26: qty 1

## 2018-04-26 MED ORDER — AZITHROMYCIN 250 MG PO TABS: 250.0000 mg | ORAL_TABLET | Freq: Every day | ORAL | 0 refills | Status: DC

## 2018-04-26 MED ORDER — IOPAMIDOL (ISOVUE-300) INJECTION 61%
INTRAVENOUS | Status: AC
  Filled 2018-04-26: qty 100

## 2018-04-26 NOTE — Discharge Instructions (Addendum)
Your evaluated in the emergency department for right-sided flank and low back pain.  Your lab work was unremarkable and your urinalysis was also unremarkable.  Your CAT scan showed that you had a pneumonia in your right lower lobe which may account for your pain.  We will start you on some antibiotics and you should continue to take Tylenol and ibuprofen for your pain.  Please return if any worsening symptoms.

## 2018-04-26 NOTE — ED Provider Notes (Signed)
[facility]-EMERGENCY DEPT Provider Note   CSN: [id number] Arrival date & time: 04/26/18  0807     History   Chief Complaint Chief Complaint  Patient presents with  . Flank Pain    HPI Holly Mcandrew is a 22 y.o. female.  She was seen about 1 week ago for acute right lower back pain.  She states that started about 24 hours prior to being seen at urgent care where they did some blood work and told her her potassium was low that she was anemic and she possibly had a UTI.  She is been taking antibiotics and over-the-counter iron and potassium supplements.  They also told her to stop her new birth control pills as that may be causing her symptoms.  She states her symptoms were improved until yesterday when she finished her last dose of antibiotics and the back pain recurred.  There was never any urinary symptoms no fevers no chills no cough no abdominal pain.  No vaginal bleeding no vaginal discharge.  No reported history of any trauma.  States she had some cramps in her lower legs on Saturday.  No swollen joints no tick bites.   The history is provided by the patient.  Back Pain   This is a new problem. The current episode started more than 1 week ago. The problem occurs constantly. The problem has not changed since onset.The pain is associated with no known injury. The pain is present in the lumbar spine. The quality of the pain is described as stabbing. The pain does not radiate. The pain is moderate. Pertinent negatives include no chest pain, no fever, no numbness, no abdominal pain, no abdominal swelling, no bowel incontinence, no perianal numbness, no bladder incontinence, no dysuria, no pelvic pain, no paresthesias, no paresis, no tingling and no weakness.    Past Medical History:  Diagnosis Date  . Scoliosis    Pt known as a child; MD @ bedside when notified     Patient Active Problem List   Diagnosis Date Noted  . Postpartum state 06/20/2016  . PROM (premature  rupture of membranes) 06/19/2016    Past Surgical History:  Procedure Laterality Date  . WISDOM TOOTH EXTRACTION       OB History    Gravida  1   Para  1   Term  1   Preterm      AB      Living  1     SAB      TAB      Ectopic      Multiple  0   Live Births  1            Home Medications    Prior to Admission medications   Medication Sig Start Date End Date Taking? Authorizing Provider  cephALEXin (KEFLEX) 500 MG capsule Take 1 capsule (500 mg total) by mouth 3 (three) times daily. 04/18/18   Elvina Sidle, MD  potassium chloride SA (K-DUR,KLOR-CON) 20 MEQ tablet Take 1 tablet (20 mEq total) by mouth 2 (two) times daily. 01/07/17   Hayden Rasmussen, NP    Family History Family History  Problem Relation Age of Onset  . Cancer Neg Hx   . Diabetes Neg Hx   . Hypertension Neg Hx     Social History Social History   Tobacco Use  . Smoking status: Never Smoker  . Smokeless tobacco: Never Used  Substance Use Topics  . Alcohol use: No  . Drug use:  No     Allergies   Patient has no known allergies.   Review of Systems Review of Systems  Constitutional: Negative for chills and fever.  HENT: Negative for ear pain and sore throat.   Eyes: Negative for pain and visual disturbance.  Respiratory: Negative for cough and shortness of breath.   Cardiovascular: Negative for chest pain and palpitations.  Gastrointestinal: Negative for abdominal pain, bowel incontinence and vomiting.  Genitourinary: Positive for flank pain. Negative for bladder incontinence, dysuria, hematuria and pelvic pain.  Musculoskeletal: Positive for back pain. Negative for arthralgias and neck pain.  Skin: Negative for color change and rash.  Neurological: Negative for tingling, seizures, syncope, weakness, numbness and paresthesias.  All other systems reviewed and are negative.    Physical Exam Updated Vital Signs BP 117/78 (BP Location: Left Arm)   Pulse 90   Temp 98.7 F  (37.1 C) (Oral)   Resp 16   LMP 04/20/2018   SpO2 100%   Physical Exam  Constitutional: She appears well-developed and well-nourished. No distress.  HENT:  Head: Normocephalic and atraumatic.  Eyes: Conjunctivae are normal.  Neck: Neck supple.  Cardiovascular: Normal rate and regular rhythm.  No murmur heard. Pulmonary/Chest: Effort normal and breath sounds normal. No respiratory distress.  Abdominal: Soft. There is no tenderness.  Musculoskeletal: Normal range of motion. She exhibits no edema, tenderness or deformity.       Arms: Neurological: She is alert. She has normal strength. No sensory deficit. GCS eye subscore is 4. GCS verbal subscore is 5. GCS motor subscore is 6.  Skin: Skin is warm and dry.  Psychiatric: She has a normal mood and affect.  Nursing note and vitals reviewed.    ED Treatments / Results  Labs (all labs ordered are listed, but only abnormal results are displayed) Labs Reviewed  URINALYSIS, ROUTINE W REFLEX MICROSCOPIC - Abnormal; Notable for the following components:      Result Value   Color, Urine STRAW (*)    All other components within normal limits  CBC WITH DIFFERENTIAL/PLATELET - Abnormal; Notable for the following components:   Hemoglobin 11.9 (*)    All other components within normal limits  COMPREHENSIVE METABOLIC PANEL - Abnormal; Notable for the following components:   Calcium 8.6 (*)    All other components within normal limits  LIPASE, BLOOD  I-STAT BETA HCG BLOOD, ED ([facility], [facility], [facility] ONLY)    EKG None  Radiology Ct Abdomen Pelvis W Contrast  Result Date: 04/26/2018 CLINICAL DATA:  Right flank pain beginning 2 weeks ago. This initially improved with antibiotics and return today. Mild dysuria. EXAM: CT ABDOMEN AND PELVIS WITH CONTRAST TECHNIQUE: Multidetector CT imaging of the abdomen and pelvis was performed using the standard protocol following bolus administration of intravenous contrast. CONTRAST:  ISOVUE-300 IOPAMIDOL  (ISOVUE-300) INJECTION 61% COMPARISON:  None. FINDINGS: Lower chest: 2 oval areas of consolidation in posterior right lower lobe at the posterior costophrenic angle. Additional minimal bibasilar atelectasis. Hepatobiliary: No focal liver abnormality is seen. No gallstones, gallbladder wall thickening, or biliary dilatation. Pancreas: Unremarkable. No pancreatic ductal dilatation or surrounding inflammatory changes. Spleen: Normal in size without focal abnormality. Adrenals/Urinary Tract: Adrenal glands are unremarkable. Kidneys are normal, without renal calculi, focal lesion, or hydronephrosis. Bladder is unremarkable. There is a 3 mm rounded calcification posterior to the urinary bladder on the right. This has an appearance compatible with a phlebolith rather than a ureteral calculus. Stomach/Bowel: Unremarkable stomach, small bowel and colon. No evidence of appendicitis.  Vascular/Lymphatic: No significant vascular findings are present. No enlarged abdominal or pelvic lymph nodes. Reproductive: Uterus and bilateral adnexa are unremarkable. Other: No abdominal wall hernia or abnormality. No abdominopelvic ascites. Musculoskeletal: Mild dextroconvex thoracolumbar scoliosis. IMPRESSION: 1. Two oval areas of consolidation in the posterior right lower lobe at the posterior costophrenic angle. These could represent areas of pneumonia or rounded atelectasis. 2. No acute abnormality in the abdomen or pelvis. Electronically Signed   By: Beckie SaltsSteven  Reid M.D.   On: 04/26/2018 11:15    Procedures Procedures (including critical care time)  Medications Ordered in ED Medications - No data to display   Initial Impression / Assessment and Plan / ED Course  I have reviewed the triage vital signs and the nursing notes.  Pertinent labs & imaging results that were available during my care of the patient were reviewed by me and considered in my medical decision making (see chart for details).  Clinical Course as of Apr 27 936  Mon Apr 26, 2018  1128 Reviewed findings of labs and CAT scan with patient.  I am no other explanation for her right-sided flank pain other than the CT findings showing posterior lobe possible pneumonia.  It would be reasonable to treat her with some antibiotics and see how she does.   [MB]    Clinical Course User Index [MB] Terrilee FilesButler, Michael C, MD     Final Clinical Impressions(s) / ED Diagnoses   Final diagnoses:  Flank pain  Pneumonia of right lower lobe due to infectious organism [facility](HCC)    ED Discharge Orders        Ordered    azithromycin (ZITHROMAX Z-PAK) 250 MG tablet  Daily     04/26/18 1129       Terrilee FilesButler, Michael C, MD 04/27/18 [phone]

## 2018-04-26 NOTE — ED Triage Notes (Signed)
Pt reports that she had right flank pain month ago and went to UC and started on antibiotics for UTI. Reports took her last dose of antibiotics yesterday and today pain in right flank has returned. Reports little dysuria. Denies blood in urine.

## 2018-04-26 NOTE — ED Notes (Signed)
Called patient.  No answer.

## 2018-04-28 ENCOUNTER — Ambulatory Visit (INDEPENDENT_AMBULATORY_CARE_PROVIDER_SITE_OTHER): Payer: 59 | Admitting: Family Medicine

## 2018-04-28 ENCOUNTER — Encounter: Payer: Self-pay | Admitting: Family Medicine

## 2018-04-28 VITALS — BP 96/68 | HR 94 | Temp 98.3°F | Wt 144.0 lb

## 2018-04-28 DIAGNOSIS — J189 Pneumonia, unspecified organism: Secondary | ICD-10-CM

## 2018-04-28 DIAGNOSIS — J181 Lobar pneumonia, unspecified organism: Secondary | ICD-10-CM

## 2018-04-28 DIAGNOSIS — R109 Unspecified abdominal pain: Secondary | ICD-10-CM | POA: Diagnosis not present

## 2018-04-28 LAB — POCT URINALYSIS DIPSTICK
BILIRUBIN UA: NEGATIVE
Blood, UA: NEGATIVE
Glucose, UA: NEGATIVE
KETONES UA: NEGATIVE
Leukocytes, UA: NEGATIVE
Nitrite, UA: NEGATIVE
PH UA: 6 (ref 5.0–8.0)
PROTEIN UA: NEGATIVE
Spec Grav, UA: 1.015 (ref 1.010–1.025)
Urobilinogen, UA: 0.2 E.U./dL

## 2018-04-28 MED ORDER — MELOXICAM 7.5 MG PO TABS: 7.5000 mg | ORAL_TABLET | Freq: Every day | ORAL | 0 refills | Status: DC

## 2018-04-28 NOTE — Patient Instructions (Addendum)
Remember you can take a probiotic.  This can be found at your local drugstore on the shelf.  Flank Pain, Adult Flank pain is pain that is located on the side of the body between the upper abdomen and the back. This area is called the flank. The pain may occur over a short period of time (acute), or it may be long-term or recurring (chronic). It may be mild or severe. Flank pain can be caused by many things, including:  Muscle soreness or injury.  Kidney stones or kidney disease.  Stress.  A disease of the spine (vertebral disk disease).  A lung infection (pneumonia).  Fluid around the lungs (pulmonary edema).  A skin rash caused by the chickenpox virus (shingles).  Tumors that affect the back of the abdomen.  Gallbladder disease.  Follow these instructions at home:  Drink enough fluid to keep your urine clear or pale yellow.  Rest as told by your health care provider.  Take over-the-counter and prescription medicines only as told by your health care provider.  Keep a journal to track what has caused your flank pain and what has made it feel better.  Keep all follow-up visits as told by your health care provider. This is important. Contact a health care provider if:  Your pain is not controlled with medicine.  You have new symptoms.  Your pain gets worse.  You have a fever.  Your symptoms last longer than 2-3 days.  You have trouble urinating or you are urinating very frequently. Get help right away if:  You have trouble breathing or you are short of breath.  Your abdomen hurts or it is swollen or red.  You have nausea or vomiting.  You feel faint or you pass out.  You have blood in your urine. Summary  Flank pain is pain that is located on the side of the body between the upper abdomen and the back.  The pain may occur over a short period of time (acute), or it may be long-term or recurring (chronic). It may be mild or severe.  Flank pain can be caused  by many things.  Contact your health care provider if your symptoms get worse or they last longer than 2-3 days. This information is not intended to replace advice given to you by your health care provider. Make sure you discuss any questions you have with your health care provider. Document Released: 01/01/2006 Document Revised: 01/23/2017 Document Reviewed: 01/23/2017 Elsevier Interactive Patient Education  2018 [employer]

## 2018-04-28 NOTE — Progress Notes (Signed)
Subjective:    Patient ID: Holly GammonKendra Chevalier, female    DOB: 05/19/1996, 22 y.o.   MRN: [id number]  No chief complaint on file.   HPI Patient was seen today for acute concern and f/u from ED.  Pt is not established with the practice. [facility].  Pt then seen in ED on 04/26/18 for R flank pain.  CT with two areas of consolidation in the posterior right lower lobe at the posterior costophrenic angle.  Likely pna, started on Azithromycin.    Pt states she is still having R flank pain.  She has a few more days of Azithromycin left.  Pt has been taking ibuprofen, which is not helping.  Pt endorse lose stools which started today.  Pt notes slight decrease in appetite (eating breakfast, lunch, and a snack).  Endorses subjective fever and sweating at night.  Patient states the pain in her back is worse at night.  Past Medical History:  Diagnosis Date  . Scoliosis    Pt known as a child; MD @ bedside when notified     No Known Allergies  ROS General: Denies chills, changes in weight, changes in appetite + night sweats, subjective fever HEENT: Denies headaches, ear pain, changes in vision, rhinorrhea, sore throat CV: Denies CP, palpitations, orthopnea + occasional SOB Pulm: Denies cough, wheezing  + occasional SOB GI: Denies abdominal pain, nausea, vomiting, diarrhea, constipation GU: Denies dysuria, hematuria, frequency, vaginal discharge Msk: Denies muscle cramps, joint pains  + flank pain/back pain Neuro: Denies weakness, numbness, tingling Skin: Denies rashes, bruising Psych: Denies depression, anxiety, hallucinations     Objective:    Blood pressure 96/68, pulse 94, temperature 98.3 F (36.8 C), temperature source Oral, weight 144 lb (65.3 kg), last menstrual period 04/20/2018, SpO2 98 %, currently breastfeeding.   Gen. Pleasant, well-nourished, in no distress, normal affect.  Appears uncomfortable, tearful at times HEENT: [place]/AT, face  symmetric, no scleral icterus, PERRLA, nares patent without drainage, pharynx without erythema or exudate. Lungs: no accessory muscle use, CTAB, no wheezes or rales Cardiovascular: RRR, no m/r/g, no peripheral edema Abdomen: BS present, soft, NT/ND Musculoskeletal: No deformities, no cyanosis or clubbing, normal tone.   mild CVA tenderness Neuro:  A&Ox3, CN II-XII intact, normal gait Skin:  Warm, no lesions/ rash   Wt Readings from Last 3 Encounters:  06/21/16 174 lb 8 oz (79.2 kg)  06/15/16 176 lb (79.8 kg)    Lab Results  Component Value Date   WBC 6.1 04/26/2018   HGB 11.9 (L) 04/26/2018   HCT 36.7 04/26/2018   PLT 321 04/26/2018   GLUCOSE 92 04/26/2018   ALT 16 04/26/2018   AST 19 04/26/2018   NA 138 04/26/2018   K 3.6 04/26/2018   CL 106 04/26/2018   CREATININE 0.91 04/26/2018   BUN 11 04/26/2018   CO2 24 04/26/2018    Assessment/Plan:  Flank pain  -UA negative -Discussed various causes including UTI, pyelonephritis, renal calculi, gallstones, pneumonia, MSK cause.  Patient reassured as CT on 04/26/2018 was negative the exception of RLL pneumonia -Discussed supportive care including heat -We will try Mobic for pain. - Plan: POCT urinalysis dipstick, meloxicam (MOBIC) 7.5 MG tablet  Pneumonia of right lower lobe due to infectious organism [facility](HCC) -advised to continue azithromycin -Discussed taking probiotic for loose stool likely 2/2 extended antibiotic use  Follow-up next week as patient has establish care appointment.  Abbe AmsterdamShannon Calloway Andrus, MD

## 2018-05-06 ENCOUNTER — Ambulatory Visit (INDEPENDENT_AMBULATORY_CARE_PROVIDER_SITE_OTHER): Payer: 59 | Admitting: Family Medicine

## 2018-05-06 ENCOUNTER — Encounter: Payer: Self-pay | Admitting: Family Medicine

## 2018-05-06 VITALS — BP 92/70 | HR 68 | Temp 98.1°F | Ht 64.0 in | Wt 147.0 lb

## 2018-05-06 DIAGNOSIS — Z0000 Encounter for general adult medical examination without abnormal findings: Secondary | ICD-10-CM | POA: Diagnosis not present

## 2018-05-06 NOTE — Progress Notes (Signed)
Patient presents to clinic today for CPE and to establish care.  Pt was previously seen on 04/28/18 for acute visit.  SUBJECTIVE: PMH:  Pt is a 22 yo with pmh sig for scoliosis.  Pt states it has been a while since she had a regular provider.  Pt has been followed by OB/Gyn.  Pt states she is doing much better since last OFV.  Pt is no longer having R flank pain and has completed the azithromycin.  Allergies: NKDA  Past Surgical hx: Wisdom tooth extraction  Social Hx: Patient is married.  She has 1 child.  Patient is a recent graduate of [employer] where she majored in business administration.  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 Pt denies tobacco or drug use.    Health Maintenance: PAP --  Jan 2019   Past Medical History:  Diagnosis Date  . Anemia   . Scoliosis    Pt known as a child; MD @ bedside when notified     Past Surgical History:  Procedure Laterality Date  . removal of cyst    . WISDOM TOOTH EXTRACTION      Current Outpatient Medications on File Prior to Visit  Medication Sig Dispense Refill  . ibuprofen (ADVIL,MOTRIN) 800 MG tablet Take 800 mg by mouth every 8 (eight) hours as needed for mild pain.    . IRON PO Take 1 tablet by mouth daily.    . meloxicam (MOBIC) 7.5 MG tablet Take 1 tablet (7.5 mg total) by mouth daily. (Patient taking differently: Take 7.5 mg by mouth as needed. ) 30 tablet 0  . potassium chloride SA (K-DUR,KLOR-CON) 20 MEQ tablet Take 1 tablet (20 mEq total) by mouth 2 (two) times daily. 8 tablet 0   No current facility-administered medications on file prior to visit.     No Known Allergies  Family History  Problem Relation Age of Onset  . Cancer Neg Hx   . Diabetes Neg Hx   . Hypertension Neg Hx     Social History   Socioeconomic History  . Marital status: Married    Spouse name: Not on file  . Number of children: Not on file  . Years of education: Not on  file  . Highest education level: Not on file  Occupational History  . Not on file  Social Needs  . Financial resource strain: Not on file  . Food insecurity:    Worry: Not on file    Inability: Not on file  . Transportation needs:    Medical: Not on file    Non-medical: Not on file  Tobacco Use  . Smoking status: Never Smoker  . Smokeless tobacco: Never Used  Substance and Sexual Activity  . Alcohol use: No  . Drug use: No  . Sexual activity: Yes  Lifestyle  . Physical activity:    Days per week: Not on file    Minutes per session: Not on file  . Stress: Not on file  Relationships  . Social connections:    Talks on phone: Not on file    Gets together: Not on file    Attends religious service: Not on file    Active member of club or organization: Not on file    Attends meetings of clubs or organizations: Not on file    Relationship status: Not on file  . Intimate partner violence:    Fear of current or ex partner: Not  on file    Emotionally abused: Not on file    Physically abused: Not on file    Forced sexual activity: Not on file  Other Topics Concern  . Not on file  Social History Narrative  . Not on file    ROS General: Denies fever, chills, night sweats, changes in weight, changes in appetite HEENT: Denies headaches, ear pain, changes in vision, rhinorrhea, sore throat CV: Denies CP, palpitations, SOB, orthopnea Pulm: Denies SOB, cough, wheezing GI: Denies abdominal pain, nausea, vomiting, diarrhea, constipation GU: Denies dysuria, hematuria, frequency, vaginal discharge Msk: Denies muscle cramps, joint pains Neuro: Denies weakness, numbness, tingling Skin: Denies rashes, bruising Psych: Denies depression, anxiety, hallucinations  BP 92/70 (BP Location: Left Arm, Patient Position: Sitting, Cuff Size: Normal)   Pulse 68   Temp 98.1 F (36.7 C) (Oral)   Ht '5\' 4"'$  (1.626 m)   Wt 147 lb (66.7 kg)   LMP 04/20/2018 Comment: neg HCG 04-25-18  SpO2 98%   BMI  25.23 kg/m   Physical Exam Gen. Pleasant, well developed, well-nourished, in NAD HEENT - [place]/AT, PERRL, no scleral icterus, no nasal drainage, pharynx without erythema or exudate. TMs normal b/l.  No cervical lymphadenopathy. Neck: No JVD, no thyromegaly, no carotid bruits Lungs: no use of accessory muscles, on, CTAB, no wheezes, rales or rhonchi Cardiovascular: RRR, No r/g/m, no peripheral edema Abdomen: BS present, soft, nontender, nondistended, no hepatosplenomegaly Musculoskeletal: No deformities, moves all four extremities, no cyanosis or clubbing, normal tone Neuro:  A&Ox3, CN II-XII intact, normal gait Skin:  Warm, dry, intact, no lesions Psych: normal affect, mood appropriate  Recent Results (from the past 2160 hour(s))  POCT urinalysis dip (device)     Status: Abnormal   Collection Time: 04/17/18  7:35 PM  Result Value Ref Range   Glucose, UA NEGATIVE NEGATIVE mg/dL   Bilirubin Urine NEGATIVE NEGATIVE   Ketones, ur NEGATIVE NEGATIVE mg/dL   Specific Gravity, Urine 1.020 1.005 - 1.030   Hgb urine dipstick NEGATIVE NEGATIVE   pH 6.0 5.0 - 8.0   Protein, ur NEGATIVE NEGATIVE mg/dL   Urobilinogen, UA 0.2 0.0 - 1.0 mg/dL   Nitrite POSITIVE (A) NEGATIVE   Leukocytes, UA NEGATIVE NEGATIVE    Comment: Biochemical Testing Only. Please order routine urinalysis from main lab if confirmatory testing is needed.  Pregnancy, urine POC     Status: None   Collection Time: 04/17/18  7:40 PM  Result Value Ref Range   Preg Test, Ur NEGATIVE NEGATIVE    Comment:        THE SENSITIVITY OF THIS METHODOLOGY IS >24 mIU/mL   Comprehensive metabolic panel     Status: Abnormal   Collection Time: 04/17/18  8:09 PM  Result Value Ref Range   Sodium 134 (L) 135 - 145 mmol/L   Potassium 3.8 3.5 - 5.1 mmol/L   Chloride 102 101 - 111 mmol/L   CO2 26 22 - 32 mmol/L   Glucose, Bld 95 65 - 99 mg/dL   BUN 8 6 - 20 mg/dL   Creatinine, Ser 1.00 0.44 - 1.00 mg/dL   Calcium 8.6 (L) 8.9 - 10.3 mg/dL    Total Protein 7.3 6.5 - 8.1 g/dL   Albumin 3.4 (L) 3.5 - 5.0 g/dL   AST 25 15 - 41 U/L   ALT 20 14 - 54 U/L   Alkaline Phosphatase 41 38 - 126 U/L   Total Bilirubin 0.5 0.3 - 1.2 mg/dL   GFR calc non Af Amer >60 >  60 mL/min   GFR calc Af Amer >60 >60 mL/min    Comment: (NOTE) The eGFR has been calculated using the CKD EPI equation. This calculation has not been validated in all clinical situations. eGFR's persistently <60 mL/min signify possible Chronic Kidney Disease.    Anion gap 6 5 - 15    Comment: Performed at [facility] [address] [place], [place] [address]  CBC with Differential     Status: Abnormal   Collection Time: 04/17/18  8:09 PM  Result Value Ref Range   WBC 8.3 4.0 - 10.5 K/uL   RBC 3.91 3.87 - 5.11 MIL/uL   Hemoglobin 11.6 (L) 12.0 - 15.0 g/dL   HCT 36.1 36.0 - 46.0 %   MCV 92.3 78.0 - 100.0 fL   MCH 29.7 26.0 - 34.0 pg   MCHC 32.1 30.0 - 36.0 g/dL   RDW 13.2 11.5 - 15.5 %   Platelets 320 150 - 400 K/uL   Neutrophils Relative % 62 %   Neutro Abs 5.1 1.7 - 7.7 K/uL   Lymphocytes Relative 27 %   Lymphs Abs 2.2 0.7 - 4.0 K/uL   Monocytes Relative 8 %   Monocytes Absolute 0.6 0.1 - 1.0 K/uL   Eosinophils Relative 3 %   Eosinophils Absolute 0.2 0.0 - 0.7 K/uL   Basophils Relative 0 %   Basophils Absolute 0.0 0.0 - 0.1 K/uL   Immature Granulocytes 0 %   Abs Immature Granulocytes 0.0 0.0 - 0.1 K/uL    Comment: Performed at [facility], [place] [address], [place], [place] [address]  CBC with Differential     Status: Abnormal   Collection Time: 04/26/18  8:41 AM  Result Value Ref Range   WBC 6.1 4.0 - 10.5 K/uL   RBC 4.02 3.87 - 5.11 MIL/uL   Hemoglobin 11.9 (L) 12.0 - 15.0 g/dL   HCT 36.7 36.0 - 46.0 %   MCV 91.3 78.0 - 100.0 fL   MCH 29.6 26.0 - 34.0 pg   MCHC 32.4 30.0 - 36.0 g/dL   RDW 13.0 11.5 - 15.5 %   Platelets 321 150 - 400 K/uL   Neutrophils Relative % 70 %   Neutro Abs 4.2 1.7 - 7.7 K/uL   Lymphocytes Relative 20 %   Lymphs  Abs 1.2 0.7 - 4.0 K/uL   Monocytes Relative 8 %   Monocytes Absolute 0.5 0.1 - 1.0 K/uL   Eosinophils Relative 2 %   Eosinophils Absolute 0.1 0.0 - 0.7 K/uL   Basophils Relative 0 %   Basophils Absolute 0.0 0.0 - 0.1 K/uL    Comment: Performed at [facility], [place] [address], [place], [place] [address]  Comprehensive metabolic panel     Status: Abnormal   Collection Time: 04/26/18  8:41 AM  Result Value Ref Range   Sodium 138 135 - 145 mmol/L   Potassium 3.6 3.5 - 5.1 mmol/L   Chloride 106 101 - 111 mmol/L   CO2 24 22 - 32 mmol/L   Glucose, Bld 92 65 - 99 mg/dL   BUN 11 6 - 20 mg/dL   Creatinine, Ser 0.91 0.44 - 1.00 mg/dL   Calcium 8.6 (L) 8.9 - 10.3 mg/dL   Total Protein 7.6 6.5 - 8.1 g/dL   Albumin 3.6 3.5 - 5.0 g/dL   AST 19 15 - 41 U/L   ALT 16 14 - 54 U/L   Alkaline Phosphatase 46 38 - 126 U/L  Total Bilirubin 0.4 0.3 - 1.2 mg/dL   GFR calc non Af Amer >60 >60 mL/min   GFR calc Af Amer >60 >60 mL/min    Comment: (NOTE) The eGFR has been calculated using the CKD EPI equation. This calculation has not been validated in all clinical situations. eGFR's persistently <60 mL/min signify possible Chronic Kidney Disease.    Anion gap 8 5 - 15    Comment: Performed at [facility], [place] [address] [place], [place] [address]  Lipase, blood     Status: None   Collection Time: 04/26/18  8:41 AM  Result Value Ref Range   Lipase 32 11 - 51 U/L    Comment: Performed at [facility], [place] [address] [place], [place] [address]  I-Stat Beta hCG blood, ED ([facility], [facility], [facility] only)     Status: None   Collection Time: 04/26/18  8:47 AM  Result Value Ref Range   I-stat hCG, quantitative <5.0 <5 mIU/mL   Comment 3            Comment:   GEST. AGE      CONC.  (mIU/mL)   <=1 WEEK        5 - 50     2 WEEKS       50 - 500     3 WEEKS       100 - 10,000     4 WEEKS     1,000 - 30,000        FEMALE AND NON-PREGNANT FEMALE:     LESS THAN 5  mIU/mL   Urinalysis, Routine w reflex microscopic- may I&O cath if menses     Status: Abnormal   Collection Time: 04/26/18  9:42 AM  Result Value Ref Range   Color, Urine STRAW (A) YELLOW   APPearance CLEAR CLEAR   Specific Gravity, Urine 1.005 1.005 - 1.030   pH 6.0 5.0 - 8.0   Glucose, UA NEGATIVE NEGATIVE mg/dL   Hgb urine dipstick NEGATIVE NEGATIVE   Bilirubin Urine NEGATIVE NEGATIVE   Ketones, ur NEGATIVE NEGATIVE mg/dL   Protein, ur NEGATIVE NEGATIVE mg/dL   Nitrite NEGATIVE NEGATIVE   Leukocytes, UA NEGATIVE NEGATIVE    Comment: Performed at [facility], [place] [address], [place], [place] [address]  POCT urinalysis dipstick     Status: None   Collection Time: 04/28/18  3:53 PM  Result Value Ref Range   Color, UA yellow    Clarity, UA clear    Glucose, UA Negative Negative   Bilirubin, UA neg    Ketones, UA neg    Spec Grav, UA 1.015 1.010 - 1.025   Blood, UA neg    pH, UA 6.0 5.0 - 8.0   Protein, UA Negative Negative   Urobilinogen, UA 0.2 0.2 or 1.0 E.U./dL   Nitrite, UA neg    Leukocytes, UA Negative Negative   Appearance     Odor      Assessment/Plan: Well adult exam  Anticipatory guidance given including wearing seatbelts, smoke detectors in the home, increasing physical activity, increasing p.o. intake of water and vegetables. -labs up to date -pap up to date, followed by OB/Gyn -given handout -next CPE in 1 yr.  F/u prn  Grier Mitts, MD

## 2018-05-06 NOTE — Patient Instructions (Signed)
Preventive Care 18-39 Years, Female Preventive care refers to lifestyle choices and visits with your health care provider that can promote health and wellness. What does preventive care include?  A yearly physical exam. This is also called an annual well check.  Dental exams once or twice a year.  Routine eye exams. Ask your health care provider how often you should have your eyes checked.  Personal lifestyle choices, including: ? Daily care of your teeth and gums. ? Regular physical activity. ? Eating a healthy diet. ? Avoiding tobacco and drug use. ? Limiting alcohol use. ? Practicing safe sex. ? Taking vitamin and mineral supplements as recommended by your health care provider. What happens during an annual well check? The services and screenings done by your health care provider during your annual well check will depend on your age, overall health, lifestyle risk factors, and family history of disease. Counseling Your health care provider may ask you questions about your:  Alcohol use.  Tobacco use.  Drug use.  Emotional well-being.  Home and relationship well-being.  Sexual activity.  Eating habits.  Work and work Statistician.  Method of birth control.  Menstrual cycle.  Pregnancy history.  Screening You may have the following tests or measurements:  Height, weight, and BMI.  Diabetes screening. This is done by checking your blood sugar (glucose) after you have not eaten for a while (fasting).  Blood pressure.  Lipid and cholesterol levels. These may be checked every 5 years starting at age 66.  Skin check.  Hepatitis C blood test.  Hepatitis B blood test.  Sexually transmitted disease (STD) testing.  BRCA-related cancer screening. This may be done if you have a family history of breast, ovarian, tubal, or peritoneal cancers.  Pelvic exam and Pap test. This may be done every 3 years starting at age 40. Starting at age 59, this may be done every 5  years if you have a Pap test in combination with an HPV test.  Discuss your test results, treatment options, and if necessary, the need for more tests with your health care provider. Vaccines Your health care provider may recommend certain vaccines, such as:  Influenza vaccine. This is recommended every year.  Tetanus, diphtheria, and acellular pertussis (Tdap, Td) vaccine. You may need a Td booster every 10 years.  Varicella vaccine. You may need this if you have not been vaccinated.  HPV vaccine. If you are 69 or younger, you may need three doses over 6 months.  Measles, mumps, and rubella (MMR) vaccine. You may need at least one dose of MMR. You may also need a second dose.  Pneumococcal 13-valent conjugate (PCV13) vaccine. You may need this if you have certain conditions and were not previously vaccinated.  Pneumococcal polysaccharide (PPSV23) vaccine. You may need one or two doses if you smoke cigarettes or if you have certain conditions.  Meningococcal vaccine. One dose is recommended if you are age 27-21 years and a first-year college student living in a residence hall, or if you have one of several medical conditions. You may also need additional booster doses.  Hepatitis A vaccine. You may need this if you have certain conditions or if you travel or work in places where you may be exposed to hepatitis A.  Hepatitis B vaccine. You may need this if you have certain conditions or if you travel or work in places where you may be exposed to hepatitis B.  Haemophilus influenzae type b (Hib) vaccine. You may need this if  you have certain risk factors.  Talk to your health care provider about which screenings and vaccines you need and how often you need them. This information is not intended to replace advice given to you by your health care provider. Make sure you discuss any questions you have with your health care provider. Document Released: 01/06/2002 Document Revised: 07/30/2016  Document Reviewed: 09/11/2015 Elsevier Interactive Patient Education  [employer].

## 2018-06-26 ENCOUNTER — Other Ambulatory Visit: Payer: Self-pay

## 2018-06-26 ENCOUNTER — Encounter (HOSPITAL_BASED_OUTPATIENT_CLINIC_OR_DEPARTMENT_OTHER): Payer: Self-pay | Admitting: Emergency Medicine

## 2018-06-26 ENCOUNTER — Emergency Department (HOSPITAL_BASED_OUTPATIENT_CLINIC_OR_DEPARTMENT_OTHER): Payer: 59

## 2018-06-26 ENCOUNTER — Emergency Department (HOSPITAL_BASED_OUTPATIENT_CLINIC_OR_DEPARTMENT_OTHER)
Admission: EM | Admit: 2018-06-26 | Discharge: 2018-06-26 | Disposition: A | Discharge status: Home / Self Care | Payer: 59 | Attending: Emergency Medicine | Admitting: Emergency Medicine

## 2018-06-26 DIAGNOSIS — Z79899 Other long term (current) drug therapy: Secondary | ICD-10-CM | POA: Diagnosis not present

## 2018-06-26 DIAGNOSIS — M79604 Pain in right leg: Secondary | ICD-10-CM | POA: Insufficient documentation

## 2018-06-26 NOTE — Discharge Instructions (Signed)
Please follow-up with your doctor for further evaluation and treatment of your leg pain.  You can use ice and heat alternating 20 minutes on, 20 minutes off.  You can take ibuprofen or Tylenol as prescribed over-the-counter, as needed for your pain.  Please return the emergency department if you develop any new or worsening symptoms including redness or warmth of the skin, spreading of the pain up or down your leg, fevers, chest pain, shortness of breath, or any other concerning symptoms.

## 2018-06-26 NOTE — ED Provider Notes (Signed)
[facility] EMERGENCY DEPARTMENT Provider Note   CSN: [id number] Arrival date & time: 06/26/18  1228     History   Chief Complaint Chief Complaint  Patient presents with  . Leg Pain    HPI Holly Abbott is a 22 y.o. female with history of anemia, scoliosis who presents with right calf pain.  Patient reports she has had the calf pain in the past and normally gets it before her menstrual cycle, but usually resolves on its own.  This time, it has not resolved.  She reports associated tingling to the area and sharp pains when she walks.  She denies any back pain.  She denies any recent long trips, surgeries, history of blood clots, exogenous estrogen use.  She denies any injury.  She has not tried any medication at home for symptoms.  HPI  Past Medical History:  Diagnosis Date  . Anemia   . Scoliosis    Pt known as a child; MD @ bedside when notified     Patient Active Problem List   Diagnosis Date Noted  . Postpartum state 06/20/2016  . PROM (premature rupture of membranes) 06/19/2016    Past Surgical History:  Procedure Laterality Date  . removal of cyst    . WISDOM TOOTH EXTRACTION       OB History    Gravida  1   Para  1   Term  1   Preterm      AB      Living  1     SAB      TAB      Ectopic      Multiple  0   Live Births  1            Home Medications    Prior to Admission medications   Medication Sig Start Date End Date Taking? Authorizing Provider  ibuprofen (ADVIL,MOTRIN) 800 MG tablet Take 800 mg by mouth every 8 (eight) hours as needed for mild pain.    [provider]  IRON PO Take 1 tablet by mouth daily.    [provider]  meloxicam (MOBIC) 7.5 MG tablet Take 1 tablet (7.5 mg total) by mouth daily. Patient taking differently: Take 7.5 mg by mouth as needed.  04/28/18   Deeann Saint, MD  potassium chloride SA (K-DUR,KLOR-CON) 20 MEQ tablet Take 1 tablet (20 mEq total) by mouth 2 (two) times daily.  01/07/17   Hayden Rasmussen, NP    Family History Family History  Problem Relation Age of Onset  . Arthritis Mother   . Asthma Father   . Diabetes Maternal Grandmother   . Diabetes Maternal Grandfather   . Asthma Paternal Grandmother   . Cancer Paternal Grandfather   . Hypertension Neg Hx     Social History Social History   Tobacco Use  . Smoking status: Never Smoker  . Smokeless tobacco: Never Used  Substance Use Topics  . Alcohol use: No  . Drug use: No     Allergies   Patient has no known allergies.   Review of Systems Review of Systems  Constitutional: Negative for chills and fever.  HENT: Negative for facial swelling and sore throat.   Respiratory: Negative for shortness of breath.   Cardiovascular: Negative for chest pain and leg swelling.  Gastrointestinal: Negative for abdominal pain, nausea and vomiting.  Genitourinary: Negative for dysuria.  Musculoskeletal: Positive for myalgias. Negative for back pain.  Skin: Negative for rash and wound.  Neurological:  Negative for headaches.  Psychiatric/Behavioral: The patient is not nervous/anxious.      Physical Exam Updated Vital Signs BP 106/66 (BP Location: Right Arm)   Pulse 80   Temp 98.3 F (36.8 C) (Oral)   Resp 18   Ht 5\' 4"  (1.626 m)   Wt 66.7 kg (147 lb)   LMP 06/22/2018   SpO2 100%   BMI 25.23 kg/m   Physical Exam  Constitutional: She appears well-developed and well-nourished. No distress.  HENT:  Head: Normocephalic and atraumatic.  Mouth/Throat: Oropharynx is clear and moist. No oropharyngeal exudate.  Eyes: Pupils are equal, round, and reactive to light. Conjunctivae are normal. Right eye exhibits no discharge. Left eye exhibits no discharge. No scleral icterus.  Neck: Normal range of motion. Neck supple. No thyromegaly present.  Cardiovascular: Normal rate, regular rhythm, normal heart sounds and intact distal pulses. Exam reveals no gallop and no friction rub.  No murmur  heard. Pulmonary/Chest: Effort normal and breath sounds normal. No stridor. No respiratory distress. She has no wheezes. She has no rales.  Abdominal: Soft. Bowel sounds are normal. She exhibits no distension. There is no tenderness. There is no rebound and no guarding.  Musculoskeletal: She exhibits no edema.       Legs: Point tenderness in the area demarcated, no skin changes noted, no notable edema noted to either legs, no bony tenderness noted to the tib-fib region or knee, no significant tenderness or swelling in the popliteal space  Lymphadenopathy:    She has no cervical adenopathy.  Neurological: She is alert. Coordination normal.  Skin: Skin is warm and dry. No rash noted. She is not diaphoretic. No pallor.  Psychiatric: She has a normal mood and affect.  Nursing note and vitals reviewed.    ED Treatments / Results  Labs (all labs ordered are listed, but only abnormal results are displayed) Labs Reviewed - No data to display  EKG None  Radiology US Venous Img Lower Unilateral Right  Result Date: 06/26/2018 CLINICAL DATA:  22 year old female with right calf aching for the past week EXAM: Right LOWER EXTREMITY VENOUS DUPLEX ULTRASOUND TECHNIQUE: Gray-scale sonography with graded compression, as well as color Doppler and duplex ultrasound were performed to evaluate the lower extremity deep venous systems from the level of the common femoral vein and including the common femoral, femoral, profunda femoral, popliteal and calf veins including the posterior tibial, peroneal and gastrocnemius veins when visible. The superficial great saphenous vein was also interrogated. Spectral Doppler was utilized to evaluate flow at rest and with distal augmentation maneuvers in the common femoral, femoral and popliteal veins. COMPARISON:  None. FINDINGS: Contralateral Common Femoral Vein: Respiratory phasicity is normal and symmetric with the symptomatic side. No evidence of thrombus. Normal  compressibility. Common Femoral Vein: No evidence of thrombus. Normal compressibility, respiratory phasicity and response to augmentation. Saphenofemoral Junction: No evidence of thrombus. Normal compressibility and flow on color Doppler imaging. Profunda Femoral Vein: No evidence of thrombus. Normal compressibility and flow on color Doppler imaging. Femoral Vein: No evidence of thrombus. Normal compressibility, respiratory phasicity and response to augmentation. Popliteal Vein: No evidence of thrombus. Normal compressibility, respiratory phasicity and response to augmentation. Calf Veins: No evidence of thrombus. Normal compressibility and flow on color Doppler imaging. Superficial Great Saphenous Vein: No evidence of thrombus. Normal compressibility. Venous Reflux:  None. Other Findings:  None. IMPRESSION: No evidence of deep venous thrombosis. Electronically Signed   By: Malachy Moan M.D.   On: 06/26/2018 15:32  Procedures Procedures (including critical care time)  Medications Ordered in ED Medications - No data to display   Initial Impression / Assessment and Plan / ED Course  I have reviewed the triage vital signs and the nursing notes.  Pertinent labs & imaging results that were available during my care of the patient were reviewed by me and considered in my medical decision making (see chart for details).     Patient with seemingly hormone related leg pain, she has had this pain for a while related to her menstrual cycle.  Venous ultrasound negative for DVT today.  No signs of Baker's cyst.  No signs of cellulitis or skin change.  Advised NSAID and ice/heat.  Follow-up to PCP for further evaluation.  Return precautions discussed.  Patient understands and agrees with plan.  Patient vitals stable throughout ED course and discharged in satisfactory condition.  Final Clinical Impressions(s) / ED Diagnoses   Final diagnoses:  Right leg pain    ED Discharge Orders    None        Emi HolesLaw, Albion Weatherholtz M, PA-C 06/26/18 1738    Tilden Fossaees, Elizabeth, MD 06/27/18 [phone]

## 2018-06-26 NOTE — ED Triage Notes (Signed)
R leg pain below the knee x 1 week. Denies injury.

## 2018-06-28 ENCOUNTER — Ambulatory Visit: Payer: Self-pay | Admitting: *Deleted

## 2018-06-28 ENCOUNTER — Encounter (HOSPITAL_BASED_OUTPATIENT_CLINIC_OR_DEPARTMENT_OTHER): Payer: Self-pay | Admitting: Emergency Medicine

## 2018-06-28 NOTE — Telephone Encounter (Signed)
Pt calling with complaints of leg pain and a numbing and tingling sensation since Saturday 7/27. Pt currently rating pain at 3-4 and describes the pain as a throbbing/sharp sensation at times. Pt denies doing any recent exercise or activity that could attribute to the pain. Pt was seen in the ED on 8/3 for complaints of leg pain and had a U/S done that was negative for a DVT. Pt denies any redness or swelling to the leg but states that it is tender to touch. Pt states that with walking it feels like she is walking on one leg which causes her to limp. Pt states that cold irritates the pain and causes it to become worse. Pt states she has not tried any over the counter medications because she did not want to mask the pain. Pt encouraged to try to take tylenol or ibuprofen to see if she would experience pain relief and to also try heat to the leg. Pt verbalized understanding. Pt has hospital f/u appt scheduled for tomorrow with Dr. Salomon FickBanks. Pt advised to return call to the office if symptoms become worse before scheduled appointment or to seek treatment in the ED. Pt verbalized understanding.  Reason for Disposition . Numbness in a leg or foot (i.e., loss of sensation)  Answer Assessment - Initial Assessment Questions 1. ONSET: "When did the pain start?"      Saturday, 7/27 2. LOCATION: "Where is the pain located?"      Right leg 3. PAIN: "How bad is the pain?"    (Scale 1-10; or mild, moderate, severe)   -  MILD (1-3): doesn't interfere with normal activities    -  MODERATE (4-7): interferes with normal activities (e.g., work or school) or awakens from sleep, limping    -  SEVERE (8-10): excruciating pain, unable to do any normal activities, unable to walk     3-4 throbbing and every now and then a sharp pain 4. WORK OR EXERCISE: "Has there been any recent work or exercise that involved this part of the body?"      no 5. CAUSE: "What do you think is causing the leg pain?"     Unknown, but pt states that  she just started to experience it Sunday during a service that was really cold 6. OTHER SYMPTOMS: "Do you have any other symptoms?" (e.g., chest pain, back pain, breathing difficulty, swelling, rash, fever, numbness, weakness)     Last night pain through back but it did last long 7. PREGNANCY: "Is there any chance you are pregnant?" "When was your last menstrual period?"     1st day of cycle 21st of July, no chance of pregnancy at this time  Protocols used: LEG PAIN-A-AH

## 2018-06-29 ENCOUNTER — Ambulatory Visit (INDEPENDENT_AMBULATORY_CARE_PROVIDER_SITE_OTHER): Payer: 59 | Admitting: Family Medicine

## 2018-06-29 ENCOUNTER — Encounter: Payer: Self-pay | Admitting: Family Medicine

## 2018-06-29 ENCOUNTER — Encounter

## 2018-06-29 VITALS — BP 98/76 | HR 78 | Temp 98.2°F | Wt 147.0 lb

## 2018-06-29 DIAGNOSIS — R253 Fasciculation: Secondary | ICD-10-CM | POA: Diagnosis not present

## 2018-06-29 NOTE — Patient Instructions (Addendum)
Please start taking a daily multivitamin to see if it helps with her muscle spasms.  Continue drinking plenty of water at least 4 to 5 16.9 fluid ounce bottles per day.   Muscle Cramps and Spasms Muscle cramps and spasms occur when a muscle or muscles tighten and you have no control over this tightening (involuntary muscle contraction). They are a common problem and can develop in any muscle. The most common place is in the calf muscles of the leg. Muscle cramps and muscle spasms are both involuntary muscle contractions, but there are some differences between the two:  Muscle cramps are painful. They come and go and may last a few seconds to 15 minutes. Muscle cramps are often more forceful and last longer than muscle spasms.  Muscle spasms may or may not be painful. They may also last just a few seconds or much longer.  Certain medical conditions, such as diabetes or Parkinson disease, can make it more likely to develop cramps or spasms. However, cramps or spasms are usually not caused by a serious underlying problem. Common causes include:  Overexertion.  Overuse from repetitive motions, or doing the same thing over and over.  Remaining in a certain position for a long period of time.  Improper preparation, form, or technique while playing a sport or doing an activity.  Dehydration.  Injury.  Side effects of some medicines.  Abnormally low levels of the salts and ions in your blood (electrolytes), especially potassium and calcium. This could happen if you are taking water pills (diuretics) or if you are pregnant.  In many cases, the cause of muscle cramps or spasms is unknown. Follow these instructions at home:  Stay well hydrated. Drink enough fluid to keep your urine clear or pale yellow.  Try massaging, stretching, and relaxing the affected muscle.  If directed, apply heat to tight or tense muscles as often as told by your health care provider. Use the heat source that your  health care provider recommends, such as a moist heat pack or a heating pad. ? Place a towel between your skin and the heat source. ? Leave the heat on for 20-30 minutes. ? Remove the heat if your skin turns bright red. This is especially important if you are unable to feel pain, heat, or cold. You may have a greater risk of getting burned.  If directed, put ice on the affected area. This may help if you are sore or have pain after a cramp or spasm. ? Put ice in a plastic bag. ? Place a towel between your skin and the bag. ? Leavethe ice on for 20 minutes, 2-3 times a day.  Take over-the-counter and prescription medicines only as told by your health care provider.  Pay attention to any changes in your symptoms. Contact a health care provider if:  Your cramps or spasms get more severe or happen more often.  Your cramps or spasms do not improve over time. This information is not intended to replace advice given to you by your health care provider. Make sure you discuss any questions you have with your health care provider. Document Released: 05/02/2002 Document Revised: 12/12/2015 Document Reviewed: 08/14/2015 Elsevier Interactive Patient Education  2018 [employer]

## 2018-06-29 NOTE — Progress Notes (Signed)
Subjective:    Patient ID: Holly SierrasKendra Lashel Abbott, female    DOB: 1996/01/31, 22 y.o.   MRN: [id number]  No chief complaint on file.   HPI Patient was seen today for ED follow-up.  Pt seen on 06/26/18 for right leg pain, DVT r/o.  Pt states since that visit she is continued to have sharp pain from her right knee down her right leg.  Pt notes her leg "jumps" and now her left leg is starting to hurt.  Pt denies injury, recent travel, new medications.  Pt notes similar symptoms, that may last a few minutes, prior to the start of her period.  LMP was 06/14/2018.  LMP typically 5 days.  This last LMP was only 3 days and patient did not have her typical muscle spasms prior to the start.  Patient has tried ibuprofen for her symptoms with no relief.  Past Medical History:  Diagnosis Date  . Anemia   . Pilonidal cyst   . Scoliosis    Pt known as a child; MD @ bedside when notified     No Known Allergies  ROS General: Denies fever, chills, night sweats, changes in weight, changes in appetite HEENT: Denies headaches, ear pain, changes in vision, rhinorrhea, sore throat CV: Denies CP, palpitations, SOB, orthopnea Pulm: Denies SOB, cough, wheezing GI: Denies abdominal pain, nausea, vomiting, diarrhea, constipation GU: Denies dysuria, hematuria, frequency, vaginal discharge Msk: Denies muscle cramps, joint pains  +muscle spasms and pain in leg(s) Neuro: Denies weakness, numbness, tingling Skin: Denies rashes, bruising Psych: Denies depression, anxiety, hallucinations     Objective:    Blood pressure 98/76, pulse 78, temperature 98.2 F (36.8 C), temperature source Oral, weight 147 lb (66.7 kg), last menstrual period 06/22/2018, SpO2 95 %, currently breastfeeding.   Gen. Pleasant, well-nourished, in no distress, normal affect   HEENT: [place]/AT, face symmetric, no scleral icterus, PERRLA, nares patent without drainage Lungs: no accessory muscle use Cardiovascular: RRR Musculoskeletal: TTP of R  anterior and lateral leg.  strenghth 5/5 in LEs b/l with fasciculations noted in the right leg, soft to pinprick sensations intact legs bilaterally.  No deformities, no cyanosis or clubbing, normal tone Neuro:  A&Ox3, CN II-XII intact, normal gait Skin:  Warm, no lesions/ rash  Wt Readings from Last 3 Encounters:  06/29/18 147 lb (66.7 kg)  06/26/18 147 lb (66.7 kg)  05/06/18 147 lb (66.7 kg)    Lab Results  Component Value Date   WBC 6.1 04/26/2018   HGB 11.9 (L) 04/26/2018   HCT 36.7 04/26/2018   PLT 321 04/26/2018   GLUCOSE 92 04/26/2018   ALT 16 04/26/2018   AST 19 04/26/2018   NA 138 04/26/2018   K 3.6 04/26/2018   CL 106 04/26/2018   CREATININE 0.91 04/26/2018   BUN 11 04/26/2018   CO2 24 04/26/2018    Assessment/Plan:  Fasciculations of muscle  -Discussed various causes of fasciculations and pain including vitamin deficiencies, dehydration, hypokalemia, MSK d/o, endometriosis. -discussed starting a daily MVI and increasing po intake of water. -if no improvement consider labs.  If labs normal or still no improvement consider nerve conduction study/EMG. -Given handout  Follow-up PRN  Abbe AmsterdamShannon Emric Kowalewski, MD

## 2018-08-19 ENCOUNTER — Ambulatory Visit: Payer: 59 | Admitting: Family Medicine

## 2018-10-28 ENCOUNTER — Ambulatory Visit (INDEPENDENT_AMBULATORY_CARE_PROVIDER_SITE_OTHER): Payer: Self-pay | Admitting: Family Medicine

## 2018-10-28 ENCOUNTER — Ambulatory Visit: Payer: Self-pay | Admitting: *Deleted

## 2018-10-28 ENCOUNTER — Encounter: Payer: Self-pay | Admitting: Family Medicine

## 2018-10-28 VITALS — BP 90/62 | HR 82 | Temp 98.1°F | Ht 64.0 in | Wt 147.3 lb

## 2018-10-28 DIAGNOSIS — J069 Acute upper respiratory infection, unspecified: Secondary | ICD-10-CM

## 2018-10-28 MED ORDER — ALBUTEROL SULFATE HFA 108 (90 BASE) MCG/ACT IN AERS: 2.0000 | INHALATION_SPRAY | Freq: Four times a day (QID) | RESPIRATORY_TRACT | 0 refills | Status: DC | PRN

## 2018-10-28 NOTE — Progress Notes (Signed)
HPI:  Using dictation device. Unfortunately this device frequently misinterprets words/phrases.   Acute visit for respiratory illness: -started:5 days ago -symptoms:nasal congestion, pnd, mild upper chest tightness - occ feels like has to pay attention to take a deep breath yesterday -denies:fever, wheezing, body aches, hemoptysis, CP, OCP use, tobacco use, thick sputum, NVD, tooth pain -has tried: OTC tylenol cold medication -sick contacts/travel/risks: no reported flu, strep or tick exposure -Hx of: CAP once - admits felt different with that as had pain  ROS: See pertinent positives and negatives per HPI.  Past Medical History:  Diagnosis Date  . Anemia   . Pilonidal cyst   . Scoliosis    Pt known as a child; MD @ bedside when notified     Past Surgical History:  Procedure Laterality Date  . INCISE AND DRAIN ABCESS  2012   pilonidal cyst  . removal of cyst    . WISDOM TOOTH EXTRACTION      Family History  Problem Relation Age of Onset  . Arthritis Mother   . Asthma Father   . Diabetes Maternal Grandmother   . Diabetes Maternal Grandfather   . Asthma Paternal Grandmother   . Cancer Paternal Grandfather   . Hypertension Neg Hx     Social History   Socioeconomic History  . Marital status: Married    Spouse name: Not on file  . Number of children: Not on file  . Years of education: Not on file  . Highest education level: Not on file  Occupational History  . Not on file  Social Needs  . Financial resource strain: Not on file  . Food insecurity:    Worry: Not on file    Inability: Not on file  . Transportation needs:    Medical: Not on file    Non-medical: Not on file  Tobacco Use  . Smoking status: Never Smoker  . Smokeless tobacco: Never Used  Substance and Sexual Activity  . Alcohol use: No  . Drug use: No  . Sexual activity: Yes  Lifestyle  . Physical activity:    Days per week: Not on file    Minutes per session: Not on file  . Stress: Not  on file  Relationships  . Social connections:    Talks on phone: Not on file    Gets together: Not on file    Attends religious service: Not on file    Active member of club or organization: Not on file    Attends meetings of clubs or organizations: Not on file    Relationship status: Not on file  Other Topics Concern  . Not on file  Social History Narrative   ** Merged History Encounter **        No current outpatient medications on file.  EXAM:  Vitals:   10/28/18 1032  BP: 90/62  Pulse: 82  Temp: 98.1 F (36.7 C)  SpO2: 98%    Body mass index is 25.28 kg/m.  GENERAL: vitals reviewed and listed above, alert, oriented, appears well hydrated and in no acute distress  HEENT: atraumatic, conjunttiva clear, no obvious abnormalities on inspection of external nose and ears, normal appearance of ear canals and TMs, clear nasal congestion, mild post oropharyngeal erythema with PND, no tonsillar edema or exudate, no sinus TTP  NECK: no obvious masses on inspection  LUNGS: clear to auscultation bilaterally, no wheezes, rales or rhonchi, good air movement  CV: HRRR, no peripheral edema  MS: moves all extremities without  noticeable abnormality  PSYCH: pleasant and cooperative, no obvious depression or anxiety  ASSESSMENT AND PLAN:  Discussed the following assessment and plan:  No diagnosis found.  -We discussed potential etiologies, with VURI and possible mild bronchitis being most likely. We discussed treatment side effects, likely course, antibiotic misuse, transmission, and signs of developing a serious illness. -discussed doing CXR - but after discussion risks/benefits she opted to hold off and try symptomatic care, alb prn. Agrees to seek care if any worsening, trouble breathing or not improving as expected. -of course, we advised to return or notify a doctor immediately if symptoms worsen or persist or new concerns arise.    There are no Patient Instructions on  file for this visit.  Terressa Koyanagi, DO

## 2018-10-28 NOTE — Patient Instructions (Signed)
Albuterol if needed per instructions.   Respiratory illness care per below.  I hope you are feeling better soon! Seek care promptly if your symptoms worsen, new concerns arise or you are not improving with treatment.   INSTRUCTIONS FOR UPPER RESPIRATORY INFECTION:  -plenty of rest and fluids  -nasal saline wash 2-3 times daily (use prepackaged nasal saline or bottled/distilled water if making your own)   -can use AFRIN nasal spray for drainage and nasal congestion - but do NOT use longer then 3-4 days  -can use tylenol (in no history of liver disease) or ibuprofen (if no history of kidney disease, bowel bleeding or significant heart disease) as directed for aches and sorethroat  -in the winter time, using a humidifier at night is helpful (please follow cleaning instructions)  -if you are taking a cough medication - use only as directed, may also try a teaspoon of honey to coat the throat and throat lozenges.  -for sore throat, salt water gargles can help  -follow up if you have fevers, facial pain, tooth pain, difficulty breathing or are worsening or symptoms persist longer then expected  Upper Respiratory Infection, Adult An upper respiratory infection (URI) is also known as the common cold. It is often caused by a type of germ (virus). Colds are easily spread (contagious). You can pass it to others by kissing, coughing, sneezing, or drinking out of the same glass. Usually, you get better in 1 to 3  weeks.  However, the cough can last for even longer. HOME CARE   Only take medicine as told by your doctor. Follow instructions provided above.  Drink enough water and fluids to keep your pee (urine) clear or pale yellow.  Get plenty of rest.  Return to work when your temperature is < 100 for 24 hours or as told by your doctor. You may use a face mask and wash your hands to stop your cold from spreading. GET HELP RIGHT AWAY IF:   After the first few days, you feel you are getting  worse.  You have questions about your medicine.  You have chills, shortness of breath, or red spit (mucus).  You have pain in the face for more then 1-2 days, especially when you bend forward.  You have a fever, puffy (swollen) neck, pain when you swallow, or white spots in the back of your throat.  You have a bad headache, ear pain, sinus pain, or chest pain.  You have a high-pitched whistling sound when you breathe in and out (wheezing).  You cough up blood.  You have sore muscles or a stiff neck. MAKE SURE YOU:   Understand these instructions.  Will watch your condition.  Will get help right away if you are not doing well or get worse. Document Released: 04/28/2008 Document Revised: 02/02/2012 Document Reviewed: 02/15/2014 [facility] Patient Information 2015 [employer] [place]. This information is not intended to replace advice given to you by your health care provider. Make sure you discuss any questions you have with your health care provider.

## 2018-10-28 NOTE — Telephone Encounter (Signed)
Patient had chest tightness with breathing- patient has pneumonia a few months ago and she feels likes this is a reoccurance .  Reason for Disposition . [1] MODERATE difficulty breathing (e.g., speaks in phrases, SOB even at rest, pulse 100-120) AND [2] NEW-onset or WORSE than normal  Answer Assessment - Initial Assessment Questions 1. RESPIRATORY STATUS: "Describe your breathing?" (e.g., wheezing, shortness of breath, unable to speak, severe coughing)      Chest tightness with breathing- lingering tightness- patient feels she is not is not getting enough air in 2. ONSET: "When did this breathing problem begin?"      Yesterday evening 3. PATTERN "Does the difficult breathing come and go, or has it been constant since it started?"      Comes and goes 4. SEVERITY: "How bad is your breathing?" (e.g., mild, moderate, severe)    - MILD: No SOB at rest, mild SOB with walking, speaks normally in sentences, can lay down, no retractions, pulse < 100.    - MODERATE: SOB at rest, SOB with minimal exertion and prefers to sit, cannot lie down flat, speaks in phrases, mild retractions, audible wheezing, pulse 100-120.    - SEVERE: Very SOB at rest, speaks in single words, struggling to breathe, sitting hunched forward, retractions, pulse > 120      moderate 5. RECURRENT SYMPTOM: "Have you had difficulty breathing before?" If so, ask: "When was the last time?" and "What happened that time?"      Patient felt like this when she had pneumonia a few months ago 6. CARDIAC HISTORY: "Do you have any history of heart disease?" (e.g., heart attack, angina, bypass surgery, angioplasty)      no 7. LUNG HISTORY: "Do you have any history of lung disease?"  (e.g., pulmonary embolus, asthma, emphysema)     no 8. CAUSE: "What do you think is causing the breathing problem?"      Patient thinks she may have reoccurrence  9. OTHER SYMPTOMS: "Do you have any other symptoms? (e.g., dizziness, runny nose, cough, chest pain,  fever)     Dizziness, runny nose, brief chest pain 10. PREGNANCY: "Is there any chance you are pregnant?" "When was your last menstrual period?"       No- LMP- 11/28 11. TRAVEL: "Have you traveled out of the country in the last month?" (e.g., travel history, exposures)       n/a  Protocols used: BREATHING DIFFICULTY-A-AH

## 2018-10-28 NOTE — Telephone Encounter (Signed)
I spoke to the Holly Abbott.  She said yesterday that she had 1 episode of chest pain.  It lasted 1-2 minutes.  Today she reports no chest pain.  Only SOB that comes and goes but she does not think it is severe, sneezing and runny nose.  I triaged her for cardiac related issues.  She denied left arm pain, stabbing chest pain, back pain and pain at her jaw or face, abdominal pain, lightheadedness and fatigue.  She is worried that she may be getting pneumonia again.  States she had it earlier this year.  I scheduled her with Dr. Kriste BasqueHannah Kim.  Will forward this message to her PCP and Dr. Selena BattenKim.

## 2018-11-09 ENCOUNTER — Ambulatory Visit (INDEPENDENT_AMBULATORY_CARE_PROVIDER_SITE_OTHER): Payer: Self-pay | Admitting: Family Medicine

## 2018-11-09 ENCOUNTER — Encounter: Payer: Self-pay | Admitting: Family Medicine

## 2018-11-09 VITALS — BP 96/70 | HR 98 | Temp 98.6°F | Wt 146.0 lb

## 2018-11-09 DIAGNOSIS — J101 Influenza due to other identified influenza virus with other respiratory manifestations: Secondary | ICD-10-CM

## 2018-11-09 DIAGNOSIS — R52 Pain, unspecified: Secondary | ICD-10-CM

## 2018-11-09 LAB — POCT INFLUENZA A/B
Influenza A, POC: NEGATIVE
Influenza B, POC: POSITIVE — AB

## 2018-11-09 MED ORDER — OSELTAMIVIR PHOSPHATE 75 MG PO CAPS: 75.0000 mg | ORAL_CAPSULE | Freq: Two times a day (BID) | ORAL | 0 refills | Status: AC

## 2018-11-09 NOTE — Patient Instructions (Signed)

## 2018-11-09 NOTE — Addendum Note (Signed)
Addended by: Carola RhineKIGOTHO, NANCY N on: 11/09/2018 02:37 PM   Modules accepted: Orders

## 2018-11-09 NOTE — Progress Notes (Signed)
Subjective:    Patient ID: Holly Abbott, female    DOB: 03-02-1996, 22 y.o.   MRN: [id number]  No chief complaint on file.   HPI Patient was seen today for acute concern.  Pt endorses coughing, sneezing, rhinorrhea, fever T-max one 1.8, chills, body aches, night sweats, left ear pain x 2 days.  Pt denies sore throat, sinus pressure.  Pt has tried Mucinex, TheraFlu, Tylenol Sinus.  Pt denies sick contacts.  Pt has never had a flu shot.  Past Medical History:  Diagnosis Date  . Anemia   . Pilonidal cyst   . Scoliosis    Pt known as a child; MD @ bedside when notified     No Known Allergies  ROS General: Denies changes in weight, changes in appetite  +fever, chills, night sweats HEENT: Denies headaches, ear pain, changes in vision, rhinorrhea, sore throat  +L ear pain, rhinorrhea, sneezing CV: Denies CP, palpitations, SOB, orthopnea Pulm: Denies SOB, cough, wheezing  +cough GI: Denies abdominal pain, nausea, vomiting, diarrhea, constipation GU: Denies dysuria, hematuria, frequency, vaginal discharge Msk: Denies muscle cramps, joint pains  +muscle aches Neuro: Denies weakness, numbness, tingling Skin: Denies rashes, bruising Psych: Denies depression, anxiety, hallucinations    Objective:    Blood pressure 96/70, pulse 98, temperature 98.6 F (37 C), temperature source Oral, weight 146 lb (66.2 kg), SpO2 97 %, currently breastfeeding.  Gen. Pleasant, well-nourished, in no distress, appears sick, normal affect   HEENT: [place]/AT, face symmetric, no scleral icterus, PERRLA, nares patent with clear drainage, pharynx without erythema or exudate.  TMs normal. No cervical lymphadenopathy. Lungs: no accessory muscle use, CTAB, no wheezes or rales Cardiovascular: RRR, no m/r/g, no peripheral edema Neuro:  A&Ox3, CN II-XII intact, normal gait  Wt Readings from Last 3 Encounters:  11/09/18 146 lb (66.2 kg)  10/28/18 147 lb 4.8 oz (66.8 kg)  06/29/18 147 lb (66.7 kg)    Lab Results   Component Value Date   WBC 6.1 04/26/2018   HGB 11.9 (L) 04/26/2018   HCT 36.7 04/26/2018   PLT 321 04/26/2018   GLUCOSE 92 04/26/2018   ALT 16 04/26/2018   AST 19 04/26/2018   NA 138 04/26/2018   K 3.6 04/26/2018   CL 106 04/26/2018   CREATININE 0.91 04/26/2018   BUN 11 04/26/2018   CO2 24 04/26/2018    Assessment/Plan:  Influenza B   -given handout -discussed supportive care - Plan: oseltamivir (TAMIFLU) 75 MG capsule -given RTC or ED precautions  Holly AmsterdamShannon Janesia Joswick, MD

## 2018-11-10 ENCOUNTER — Ambulatory Visit: Payer: Self-pay

## 2018-11-10 NOTE — Telephone Encounter (Signed)
Patient called in with c/o "fever." She says "I was diagnosed with the flu yesterday and I am still running a fever. It was 102.2, I took Tylenol 500 mg around 12 today. I was taking Ibuprofen yesterday, but it wasn't helping the fever. I'm coughing, sneezing, have a runny nose." Care advice given, patient verbalized understanding. Advised the fever could last up to 3 days and to alternate every 4 hours Tylenol and Ibuprofen until the fevers are less than 101, patient verbalized understanding.  Reason for Disposition . [1] Fever AND [2] no signs of serious infection or localizing symptoms (all other triage questions negative)  Answer Assessment - Initial Assessment Questions 1. TEMPERATURE: "What is the most recent temperature?"  "How was it measured?"      102.2 2. ONSET: "When did the fever start?"      Monday 3. SYMPTOMS: "Do you have any other symptoms besides the fever?"  (e.g., colds, headache, sore throat, earache, cough, rash, diarrhea, vomiting, abdominal pain)     Flu positive, cough, sneezing, runny nose 4. CAUSE: If there are no symptoms, ask: "What do you think is causing the fever?"      Flu 5. CONTACTS: "Does anyone else in the family have an infection?"     No 6. TREATMENT: "What have you done so far to treat this fever?" (e.g., medications)     Tylenol x 24 hours 7. IMMUNOCOMPROMISE: "Do you have of the following: diabetes, HIV positive, splenectomy, cancer chemotherapy, chronic steroid treatment, transplant patient, etc."     No 8. PREGNANCY: "Is there any chance you are pregnant?" "When was your last menstrual period?"    No; LMP 10/21/18 9. TRAVEL: "Have you traveled out of the country in the last month?" (e.g., travel history, exposures)    No  Protocols used: FEVER-A-AH

## 2018-12-31 ENCOUNTER — Encounter: Payer: Self-pay | Admitting: Internal Medicine

## 2018-12-31 ENCOUNTER — Ambulatory Visit: Payer: BC Managed Care – PPO | Admitting: Internal Medicine

## 2018-12-31 VITALS — BP 108/62 | HR 84 | Temp 98.7°F | Wt 145.6 lb

## 2018-12-31 DIAGNOSIS — Z8701 Personal history of pneumonia (recurrent): Secondary | ICD-10-CM | POA: Diagnosis not present

## 2018-12-31 DIAGNOSIS — J019 Acute sinusitis, unspecified: Secondary | ICD-10-CM

## 2018-12-31 DIAGNOSIS — J22 Unspecified acute lower respiratory infection: Secondary | ICD-10-CM

## 2018-12-31 MED ORDER — AMOXICILLIN-POT CLAVULANATE 875-125 MG PO TABS: 1.0000 | ORAL_TABLET | Freq: Two times a day (BID) | ORAL | 0 refills | Status: DC

## 2018-12-31 NOTE — Patient Instructions (Addendum)
This is a viral respiratory infection with some sinus involvement   Saline nose spray to keep ,mucous thin and can add decongestants in day  ( sudafed )   To help it draine. usually resolves on its own in a week . However if  Continuing  Localized congestion and blood on the right side  After a few days  Can add antibiotic  To help resolve the sinus infection .   No signs  of  Pneumonia today

## 2018-12-31 NOTE — Progress Notes (Signed)
Chief Complaint  Patient presents with  . Cough    x 3 days. No fever, chills, or body aches. productive cough with thick yellow mucus.     HPI: Holly Abbott 23 y.o. come in for SDA  PCP appt NA  Cough keeps up at night no fever .   Onset with st    3 days ago .  Right upper back .  Discomfort  Thick yellow drainage   Right more than left with some blood streaks . Only on right byut no face pain fever  St.  Had pna last year .  Works at [employer] .  Had flu in dec and pna last year  Presented as back pain .  ROS: See pertinent positives and negatives per HPI. No  Sob fever chills   Past Medical History:  Diagnosis Date  . Anemia   . Pilonidal cyst   . Scoliosis    Pt known as a child; MD @ bedside when notified     Family History  Problem Relation Age of Onset  . Arthritis Mother   . Asthma Father   . Diabetes Maternal Grandmother   . Diabetes Maternal Grandfather   . Asthma Paternal Grandmother   . Cancer Paternal Grandfather   . Hypertension Neg Hx     Social History   Socioeconomic History  . Marital status: Married    Spouse name: Not on file  . Number of children: Not on file  . Years of education: Not on file  . Highest education level: Not on file  Occupational History  . Not on file  Social Needs  . Financial resource strain: Not on file  . Food insecurity:    Worry: Not on file    Inability: Not on file  . Transportation needs:    Medical: Not on file    Non-medical: Not on file  Tobacco Use  . Smoking status: Never Smoker  . Smokeless tobacco: Never Used  Substance and Sexual Activity  . Alcohol use: No  . Drug use: No  . Sexual activity: Yes  Lifestyle  . Physical activity:    Days per week: Not on file    Minutes per session: Not on file  . Stress: Not on file  Relationships  . Social connections:    Talks on phone: Not on file    Gets together: Not on file    Attends religious service: Not on file    Active member of club  or organization: Not on file    Attends meetings of clubs or organizations: Not on file    Relationship status: Not on file  Other Topics Concern  . Not on file  Social History Narrative   ** Merged History Encounter **        Outpatient Medications Prior to Visit  Medication Sig Dispense Refill  . albuterol (PROVENTIL HFA;VENTOLIN HFA) 108 (90 Base) MCG/ACT inhaler Inhale 2 puffs into the lungs every 6 (six) hours as needed. 1 Inhaler 0   No facility-administered medications prior to visit.      EXAM:  BP 108/62 (BP Location: Right Arm, Patient Position: Sitting, Cuff Size: Normal)   Pulse 84   Temp 98.7 F (37.1 C) (Oral)   Wt 145 lb 9.6 oz (66 kg)   LMP 12/12/2018   SpO2 97%   Breastfeeding No   BMI 24.99 kg/m   Body mass index is 24.99 kg/m. WDWN in NAD  quiet respirations; mildly congested  somewhat hoarse. Non toxic . Congested  HEENT: Normocephalic ;atraumatic , Eyes;  PERRL, EOMs  Full, lids and conjunctiva clear,,Ears: no deformities, canals nl, TM landmarks normal, Nose: no deformity or discharge but verycongested;face nontender Mouth : OP clear without lesion or edema . Neck: Supple without adenopathy or masses or bruits Chest:  Clear to A without wheezes rales or rhonchi CV:  S1-S2 no gallops or murmurs peripheral perfusion is normal Skin :nl perfusion and no acute rashes     BP Readings from Last 3 Encounters:  12/31/18 108/62  11/09/18 96/70  10/28/18 90/62    ASSESSMENT AND PLAN:  Discussed the following assessment and plan:  Acute respiratory infection  Acute rhinosinusitis  History of pneumonia   Expectant management. And sx rx  If localizing   Can add antibiotic  over the weekend . Antibiotic if needed  Printed  -Patient advised to return or notify health care team  if  new concerns arise.  Patient Instructions  This is a viral respiratory infection with some sinus involvement   Saline nose spray to keep ,mucous thin and can add  decongestants in day  ( sudafed )   To help it draine. usually resolves on its own in a week . However if  Continuing  Localized congestion and blood on the right side  After a few days  Can add antibiotic  To help resolve the sinus infection .   No signs  of  Pneumonia today    Neta Mends. Panosh M.D.

## 2019-02-28 ENCOUNTER — Encounter: Payer: Self-pay | Admitting: Family Medicine

## 2019-02-28 ENCOUNTER — Other Ambulatory Visit: Payer: Self-pay

## 2019-02-28 ENCOUNTER — Ambulatory Visit (INDEPENDENT_AMBULATORY_CARE_PROVIDER_SITE_OTHER): Payer: BC Managed Care – PPO | Admitting: Family Medicine

## 2019-02-28 DIAGNOSIS — N926 Irregular menstruation, unspecified: Secondary | ICD-10-CM

## 2019-02-28 NOTE — Progress Notes (Signed)
Virtual Visit via Video Note  I connected with Holly Abbott on 02/28/19 at  4:00 PM EDT by a video enabled telemedicine application and verified that I am speaking with the correct person using two identifiers.  Location patient: home Location provider: home office Persons participating in the virtual visit: patient, provider  I discussed the limitations of evaluation and management by telemedicine and the availability of in person appointments. The patient expressed understanding and agreed to proceed.   HPI: Pt endorses late menses.  Had a negative pregnancy test last Friday.  Endorses feeling anxious about missed period.  Typically regular.  Currently 32 days since LMP according to the tracker on her phone.  Having symptoms similar to when she has a period including low back pain, pain in legs, mild dizziness.  Pt denies n/v, lower abdominal pain, fever, chills.  Pt denies increased exercise, new medications.  Pt notes some stress as she has been working from home x 3 wks.  Pt endorses having unprotected sex as she is married.  ROS: See pertinent positives and negatives per HPI.  Past Medical History:  Diagnosis Date  . Anemia   . Pilonidal cyst   . Scoliosis    Pt known as a child; MD @ bedside when notified     Past Surgical History:  Procedure Laterality Date  . INCISE AND DRAIN ABCESS  2012   pilonidal cyst  . removal of cyst    . WISDOM TOOTH EXTRACTION      Family History  Problem Relation Age of Onset  . Arthritis Mother   . Asthma Father   . Diabetes Maternal Grandmother   . Diabetes Maternal Grandfather   . Asthma Paternal Grandmother   . Cancer Paternal Grandfather   . Hypertension Neg Hx     SOCIAL HX:    Current Outpatient Medications:  .  albuterol (PROVENTIL HFA;VENTOLIN HFA) 108 (90 Base) MCG/ACT inhaler, Inhale 2 puffs into the lungs every 6 (six) hours as needed., Disp: 1 Inhaler, Rfl: 0 .  amoxicillin-clavulanate (AUGMENTIN) 875-125 MG tablet,  Take 1 tablet by mouth 2 (two) times daily. If needed for sinus infection, Disp: 14 tablet, Rfl: 0  EXAM:  VITALS per patient if applicable:  RR between 12-20 bpm  GENERAL: alert, oriented, appears well and in no acute distress  HEENT: atraumatic, conjunctiva clear, no obvious abnormalities on inspection of external nose and ears  NECK: normal movements of the head and neck  LUNGS: on inspection no signs of respiratory distress, breathing rate appears normal, no obvious gross SOB, gasping or wheezing  CV: no obvious cyanosis  MS: moves all visible extremities without noticeable abnormality  PSYCH/NEURO: pleasant and cooperative, no obvious depression or anxiety, speech and thought processing grossly intact  ASSESSMENT AND PLAN:  Discussed the following assessment and plan:  Missed menses  -discussed various causes of missed/absent menses including anovulation, increased stress, thyroid dysfunction, pregnancy, ectopic pregnancy. -discussed repeating urine hCG test on Friday (one week from last test) -if needed will obtain labs (serum hCG) and imaging. -f/u prn in the next wk. -given precautions including but not limited to abdominal pain, fever, n/v, etc.    I discussed the assessment and treatment plan with the patient. The patient was provided an opportunity to ask questions and all were answered. The patient agreed with the plan and demonstrated an understanding of the instructions.   The patient was advised to call back or seek an in-person evaluation if the symptoms worsen or if  the condition fails to improve as anticipated.  Deeann Saint, MD

## 2019-06-14 ENCOUNTER — Other Ambulatory Visit: Payer: Self-pay

## 2019-06-14 ENCOUNTER — Ambulatory Visit (INDEPENDENT_AMBULATORY_CARE_PROVIDER_SITE_OTHER): Payer: BC Managed Care – PPO | Admitting: Family Medicine

## 2019-06-14 ENCOUNTER — Encounter: Payer: Self-pay | Admitting: Family Medicine

## 2019-06-14 ENCOUNTER — Telehealth: Payer: Self-pay | Admitting: *Deleted

## 2019-06-14 DIAGNOSIS — R51 Headache: Secondary | ICD-10-CM | POA: Diagnosis not present

## 2019-06-14 DIAGNOSIS — R519 Headache, unspecified: Secondary | ICD-10-CM

## 2019-06-14 NOTE — Progress Notes (Signed)
Virtual Visit via Video Note  I connected with Glennon Hamilton on 06/14/19 at  1:30 PM EDT by a video enabled telemedicine application and verified that I am speaking with the correct person using two identifiers.  Location patient: home Location provider:work or home office Persons participating in the virtual visit: patient, provider  I discussed the limitations of evaluation and management by telemedicine and the availability of in person appointments. The patient expressed understanding and agreed to proceed.   HPI: Pt with intermittent HAs.  Started 2 days ago.  Pt started wearing her glasses to see if the HA would resolve.  Endorses nausea.  Took one pill of excedrin.  Sat in a dark room.  Pain in frontal area or head. Drinking ~42 oz water/day.  Getting 7-8 hours of sleep.  Pt's menses to start in 7 days.  Denies drinking caffeine, wine, cheese.  Having a HA now.  Denies weakness, changes in vision, emesis.  ROS: See pertinent positives and negatives per HPI.  Past Medical History:  Diagnosis Date  . Anemia   . Pilonidal cyst   . Scoliosis    Pt known as a child; MD @ bedside when notified     Past Surgical History:  Procedure Laterality Date  . INCISE AND DRAIN ABCESS  2012   pilonidal cyst  . removal of cyst    . WISDOM TOOTH EXTRACTION      Family History  Problem Relation Age of Onset  . Arthritis Mother   . Asthma Father   . Diabetes Maternal Grandmother   . Diabetes Maternal Grandfather   . Asthma Paternal Grandmother   . Cancer Paternal Grandfather   . Hypertension Neg Hx      Current Outpatient Medications:  .  albuterol (PROVENTIL HFA;VENTOLIN HFA) 108 (90 Base) MCG/ACT inhaler, Inhale 2 puffs into the lungs every 6 (six) hours as needed., Disp: 1 Inhaler, Rfl: 0 .  amoxicillin-clavulanate (AUGMENTIN) 875-125 MG tablet, Take 1 tablet by mouth 2 (two) times daily. If needed for sinus infection, Disp: 14 tablet, Rfl: 0  EXAM:  VITALS per patient if  applicable: RR between 19-62 bpm  GENERAL: alert, oriented, appears well and in no acute distress  HEENT: atraumatic, conjunctiva clear, no obvious abnormalities on inspection of external nose and ears  NECK: normal movements of the head and neck  LUNGS: on inspection no signs of respiratory distress, breathing rate appears normal, no obvious gross SOB, gasping or wheezing  CV: no obvious cyanosis  MS: moves all visible extremities without noticeable abnormality  PSYCH/NEURO: pleasant and cooperative, no obvious depression or anxiety, speech and thought processing grossly intact  ASSESSMENT AND PLAN:  Discussed the following assessment and plan:  Nonintractable headache, unspecified chronicity pattern, unspecified headache type  -pt to keep a HA diary -instructed to take 2 Excedrin migraine tabs prn -pt to increase po intake of water daily -if HAs continue will discuss other medication options -f/u prn    I discussed the assessment and treatment plan with the patient. The patient was provided an opportunity to ask questions and all were answered. The patient agreed with the plan and demonstrated an understanding of the instructions.   The patient was advised to call back or seek an in-person evaluation if the symptoms worsen or if the condition fails to improve as anticipated.  Billie Ruddy, MD

## 2019-06-14 NOTE — Telephone Encounter (Signed)
Copied from [place] [id number]. Topic: Appointment Scheduling - Scheduling Inquiry for Clinic >> Jun 14, 2019  8:53 AM Virl Axe D wrote: Reason for CRM: Pt called to schedule appt with Dr. Volanda Napoleon regarding constant headaches. No answer on FC Line x3. Please return call.  Virtual appointment made for 1:30 PM with Dr. Volanda Napoleon

## 2019-06-27 IMAGING — CT CT ABD-PELV W/ CM
2 of 4 series · 16 of 46 positions shown, 18 images · IV contrast (ISOVUE)
Comparison: None.

CLINICAL DATA: Right flank pain beginning 2 weeks ago. This
initially improved with antibiotics and return today. Mild dysuria.

EXAM:
CT ABDOMEN AND PELVIS WITH CONTRAST
TECHNIQUE: Multidetector CT imaging of the abdomen and pelvis was performed
using the standard protocol following bolus administration of
intravenous contrast.
CONTRAST:  100mL GR876B-SGG IOPAMIDOL (GR876B-SGG) INJECTION 61%

[Series 2: axial st · axial · 0.66mm/px · z∈[+1363,+1738]mm · 13 of 83 slices shown, 15 images]
[im 4/83  soft-tissue]
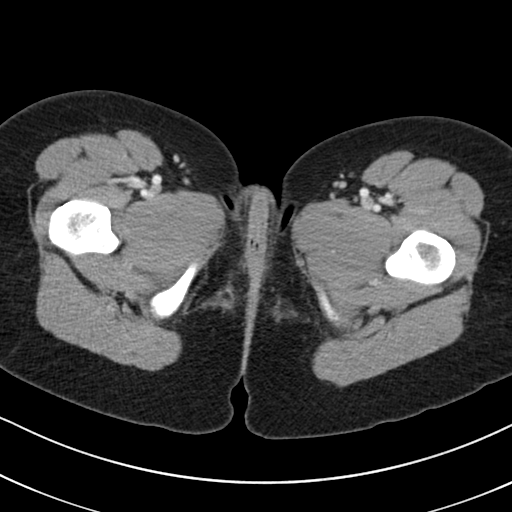
[im 4/83  bone]
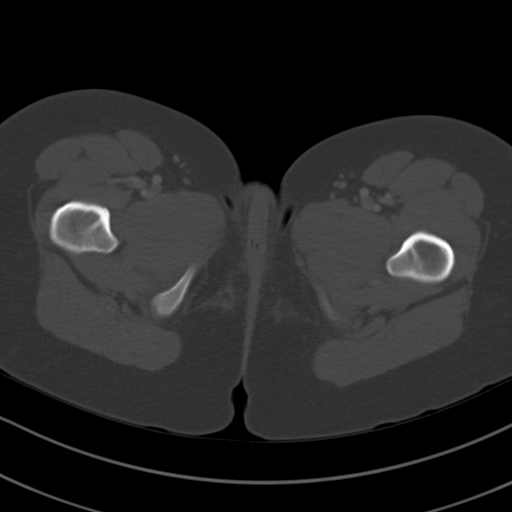
[im 12/83  soft-tissue]
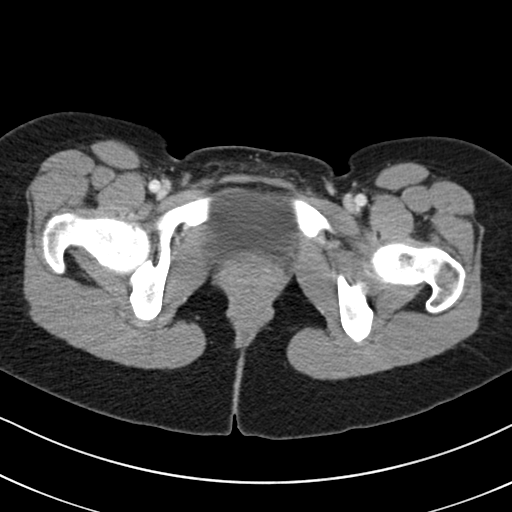
[im 19/83  soft-tissue]
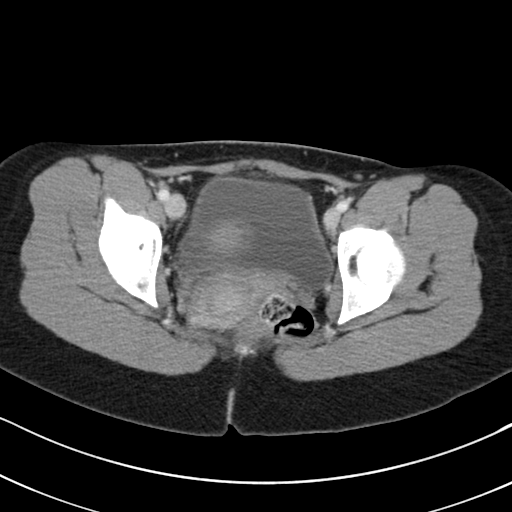
[im 23/83  soft-tissue]
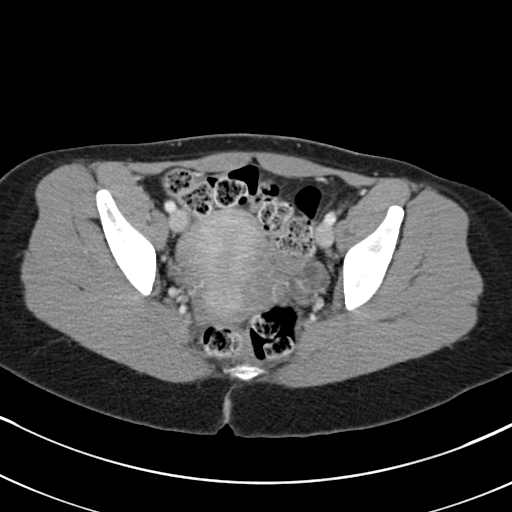
[im 30/83  soft-tissue]
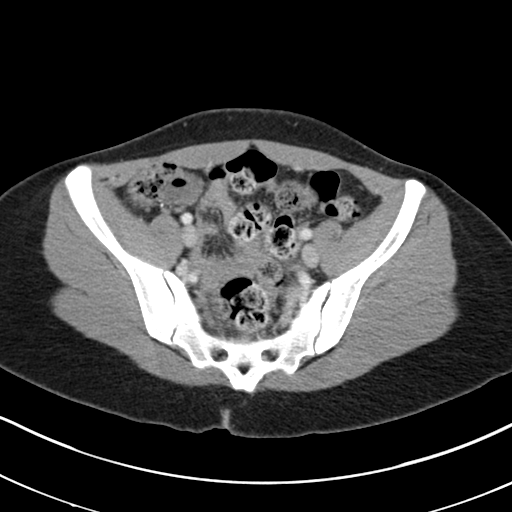
[im 34/83  soft-tissue]
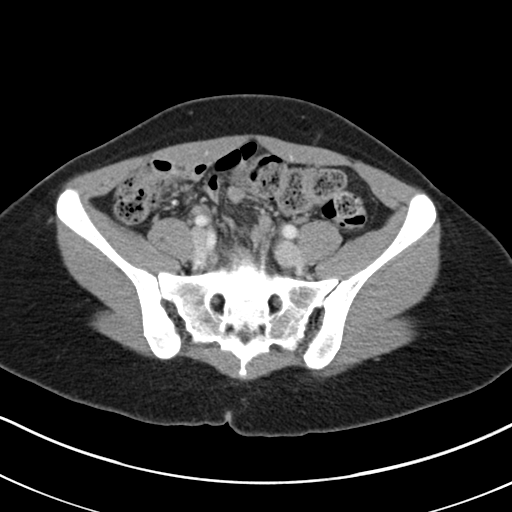
[im 42/83  soft-tissue]
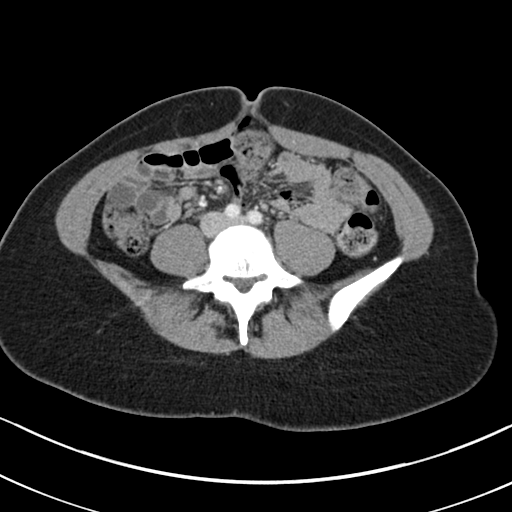
[im 49/83  soft-tissue]
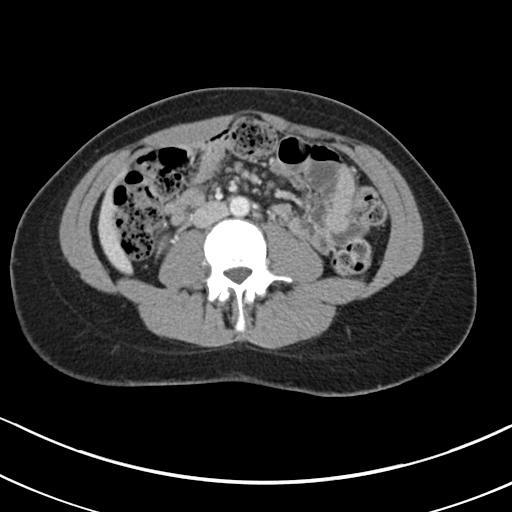
[im 53/83  soft-tissue]
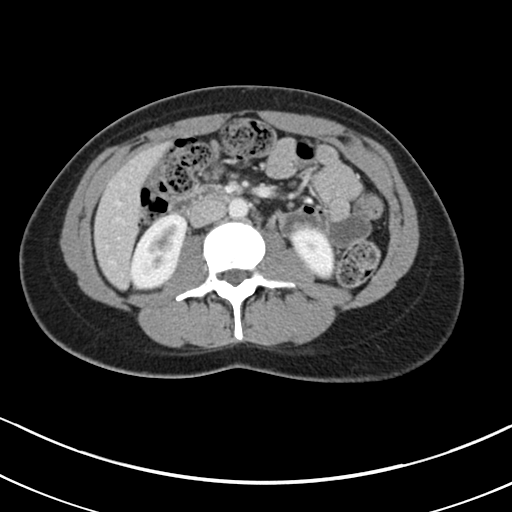
[im 53/83  bone]
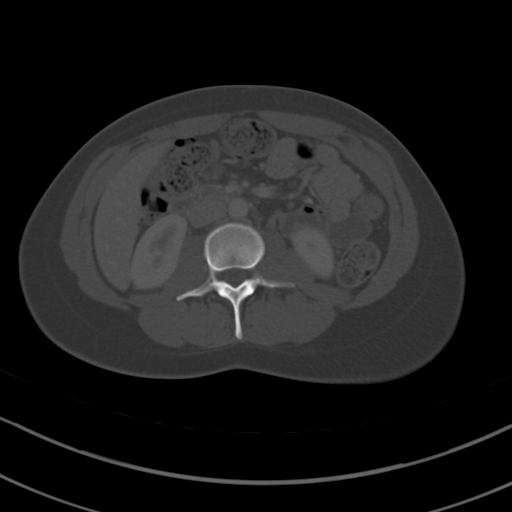
[im 60/83  soft-tissue]
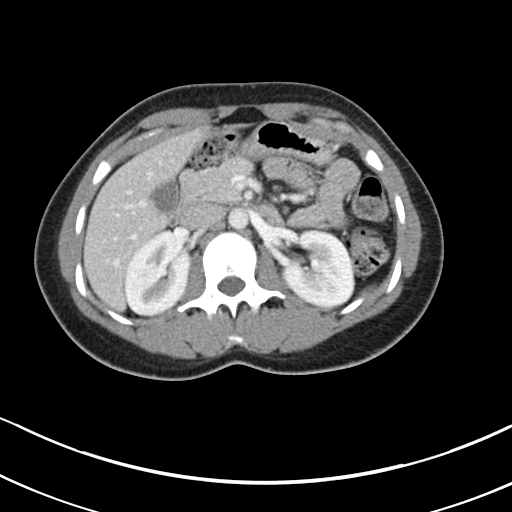
[im 64/83  soft-tissue]
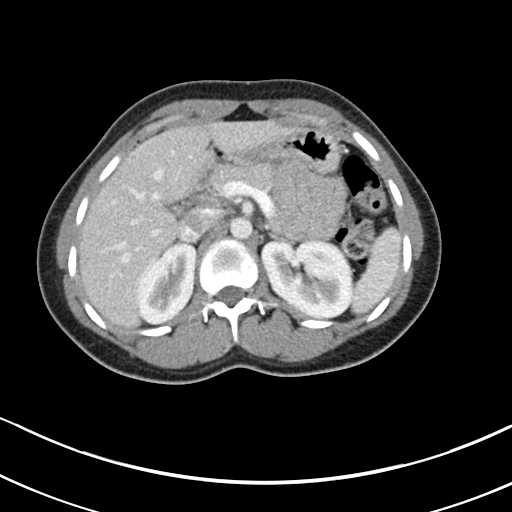
[im 71/83  soft-tissue]
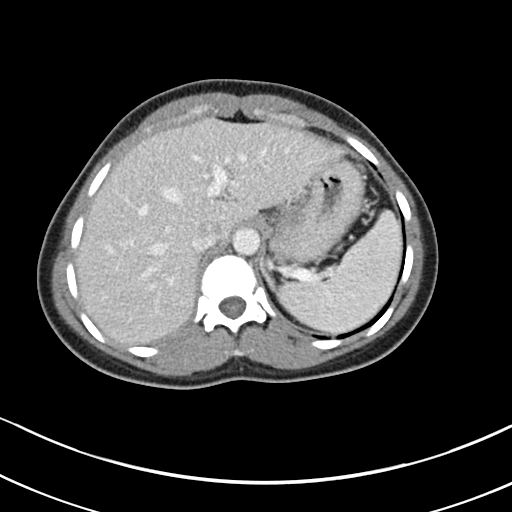
[im 79/83  soft-tissue]
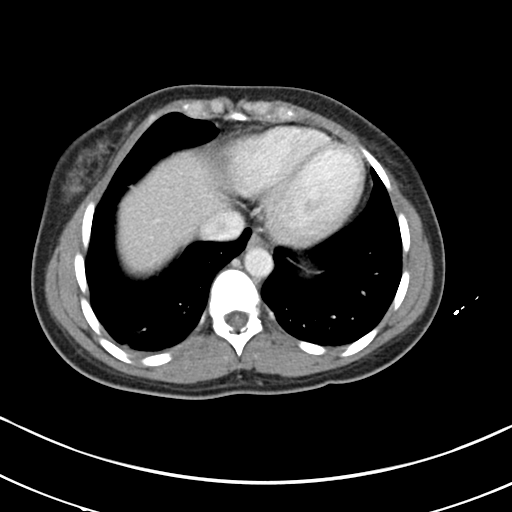

[Series 5: coronal st · coronal · 0.71mm/px · 3 of 91 slices shown]
[im 31/91  soft-tissue]
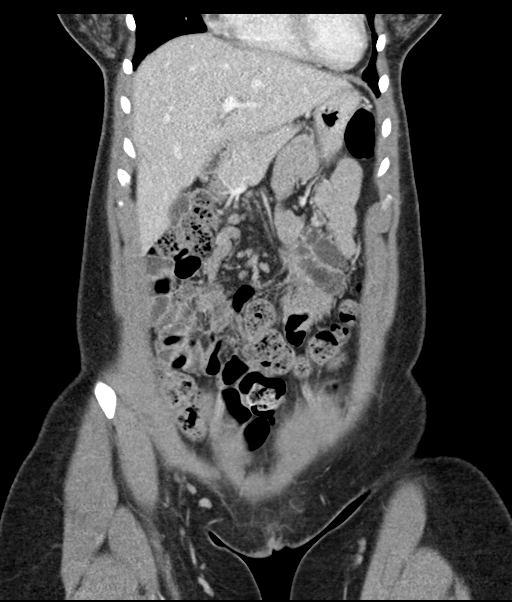
[im 41/91  soft-tissue]
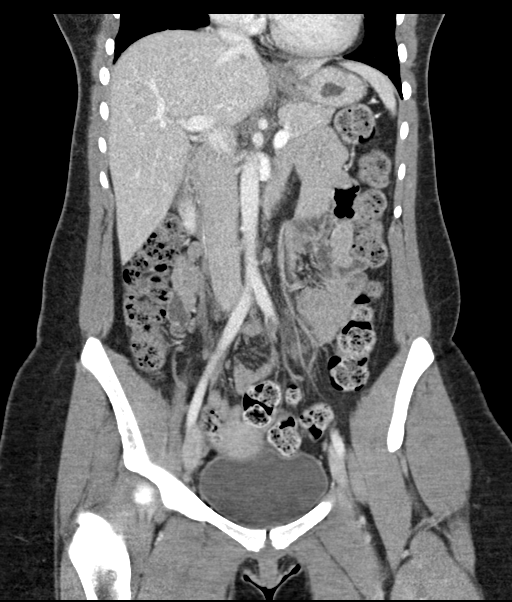
[im 51/91  soft-tissue]
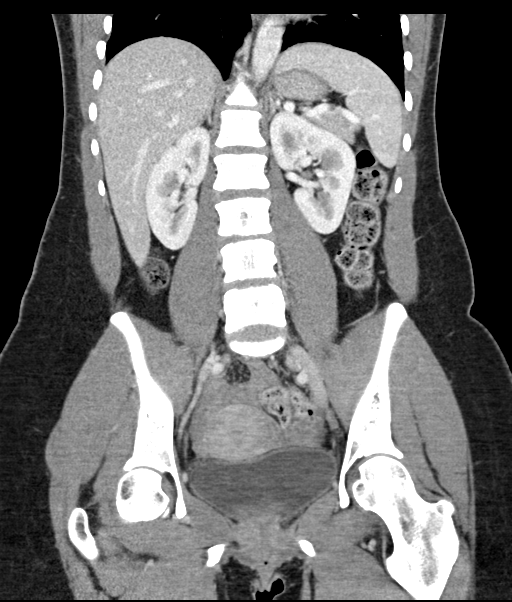

[16 of 46 positions shown; findings below may reference images not displayed]

FINDINGS: Lower chest: 2 oval areas of consolidation in posterior right lower
lobe at the posterior costophrenic angle. Additional minimal
bibasilar atelectasis.

Hepatobiliary: No focal liver abnormality is seen. No gallstones,
gallbladder wall thickening, or biliary dilatation.

Pancreas: Unremarkable. No pancreatic ductal dilatation or
surrounding inflammatory changes.

Spleen: Normal in size without focal abnormality.

Adrenals/Urinary Tract: Adrenal glands are unremarkable. Kidneys are
normal, without renal calculi, focal lesion, or hydronephrosis.
Bladder is unremarkable. There is a 3 mm rounded calcification
posterior to the urinary bladder on the right. This has an
appearance compatible with a phlebolith rather than a ureteral
calculus.

Stomach/Bowel: Unremarkable stomach, small bowel and colon. No
evidence of appendicitis.

Vascular/Lymphatic: No significant vascular findings are present. No
enlarged abdominal or pelvic lymph nodes.

Reproductive: Uterus and bilateral adnexa are unremarkable.

Other: No abdominal wall hernia or abnormality. No abdominopelvic
ascites.

Musculoskeletal: Mild dextroconvex thoracolumbar scoliosis.
IMPRESSION: 1. Two oval areas of consolidation in the posterior right lower lobe
at the posterior costophrenic angle. These could represent areas of
pneumonia or rounded atelectasis.
2. No acute abnormality in the abdomen or pelvis.

## 2019-08-11 ENCOUNTER — Other Ambulatory Visit: Payer: Self-pay

## 2019-08-11 ENCOUNTER — Encounter: Payer: Self-pay | Admitting: Family Medicine

## 2019-08-11 ENCOUNTER — Telehealth (INDEPENDENT_AMBULATORY_CARE_PROVIDER_SITE_OTHER): Payer: BC Managed Care – PPO | Admitting: Family Medicine

## 2019-08-11 VITALS — Temp 98.2°F

## 2019-08-11 DIAGNOSIS — R0981 Nasal congestion: Secondary | ICD-10-CM | POA: Diagnosis not present

## 2019-08-11 DIAGNOSIS — R52 Pain, unspecified: Secondary | ICD-10-CM | POA: Diagnosis not present

## 2019-08-11 DIAGNOSIS — Z20822 Contact with and (suspected) exposure to covid-19: Secondary | ICD-10-CM

## 2019-08-11 DIAGNOSIS — R51 Headache: Secondary | ICD-10-CM

## 2019-08-11 DIAGNOSIS — R6889 Other general symptoms and signs: Secondary | ICD-10-CM

## 2019-08-11 DIAGNOSIS — R519 Headache, unspecified: Secondary | ICD-10-CM

## 2019-08-11 NOTE — Progress Notes (Signed)
Virtual Visit via Video Note  I connected with Blaire  on 08/11/19 at 11:20 AM EDT by a video enabled telemedicine application and verified that I am speaking with the correct person using two identifiers.  Location patient: home Location provider:work or home office Persons participating in the virtual visit: patient, provider  I discussed the limitations of evaluation and management by telemedicine and the availability of in person appointments. The patient expressed understanding and agreed to proceed.   HPI:  Acute visit for sinus congestion: -started about 24 hours ago -symptoms include nasal congestion, drainage, sneezing, HA, R ear pain yesterday, mild body aches -denies fevers, excessive cough, SOB, diarrhea, vomiting, loss of taste or smell, sinus pain -T 98.0 -no known sick contacts, she works in administration at [employer] and they recommended she stay home and contact her doctor -she has a history for seasonal allergies, they have been really bad this year -she has not been taking anything for her allergies right now   ROS: See pertinent positives and negatives per HPI.  Past Medical History:  Diagnosis Date  . Anemia   . Pilonidal cyst   . Scoliosis    Pt known as a child; MD @ bedside when notified     Past Surgical History:  Procedure Laterality Date  . INCISE AND DRAIN ABCESS  2012   pilonidal cyst  . removal of cyst    . WISDOM TOOTH EXTRACTION      Family History  Problem Relation Age of Onset  . Arthritis Mother   . Asthma Father   . Diabetes Maternal Grandmother   . Diabetes Maternal Grandfather   . Asthma Paternal Grandmother   . Cancer Paternal Grandfather   . Hypertension Neg Hx     SOCIAL HX: see hpi   Current Outpatient Medications:  .  albuterol (PROVENTIL HFA;VENTOLIN HFA) 108 (90 Base) MCG/ACT inhaler, Inhale 2 puffs into the lungs every 6 (six) hours as needed., Disp: 1 Inhaler, Rfl: 0  EXAM:  VITALS per patient if  applicable: T 98  GENERAL: alert, oriented, appears well and in no acute distress  HEENT: atraumatic, conjunttiva clear, no obvious abnormalities on inspection of external nose and ears, no gross rhinorrhea, moist lips without cracking or dryness apparent on video, no sinus TTP on pt self exam today  NECK: normal movements of the head and neck  LUNGS: on inspection no signs of respiratory distress, breathing rate appears normal, no obvious gross SOB, gasping or wheezing, no coughing during this video visit  CV: no obvious cyanosis  MS: moves all visible extremities without noticeable abnormality  PSYCH/NEURO: pleasant and cooperative, no obvious depression or anxiety, speech and thought processing grossly intact  ASSESSMENT AND PLAN:  Discussed the following assessment and plan:  Sinus congestion  Nonintractable headache, unspecified chronicity pattern, unspecified headache type  Body aches  Suspected or possible Covid-19 Virus Infection  -we discussed possible serious and likely etiologies, options for evaluation and workup, limitations of telemedicine visit vs in person visit, treatment, treatment risks and precautions. Pt prefers to treat via telemedicine empirically rather then risking or undertaking an in person visit at this moment. Suspect VURI vs allergic rhinitis. Discussed overlap with symptoms over COVID19 and she agrees to home isolation and testing. Discussed limitations of testing and that treatment is the same even if testing negative - she plans to do testing through her work. Work slip provided below and advised if needs on official letterhead to contact our office. Treatment is symptomatic  currently with nasal saline, prn OTC analgesic per instructions, antihistamine once daily, VIt D and C per instructions.  Patient agrees to seek prompt in person care if worsening, new symptoms arise, positive COVID19 test or if is not improving with treatment.   I discussed the  assessment and treatment plan with the patient. The patient was provided an opportunity to ask questions and all were answered. The patient agreed with the plan and demonstrated an understanding of the instructions.   The patient was advised to call back or seek an in-person evaluation if the symptoms worsen or if the condition fails to improve as anticipated.   Lucretia Kern, DO   Patient Instructions  Follow up: as needed if symptoms worsen, new concerns arise or you are not improving as expected.   Self Isolation/Home Quarantine: -see the CDC site for information:   RunningShows.co.za.html   -STAY HOME except for to seek medical care -stay in your own room away from others in your house and use a separate bathroom if possible -Wash hands frequently, wear a mask if you leave your room and interact as little as possible with others -seek medical care immediately if worsening - call our office for a visit or call ahead if going elsewhere to an urgent care  -seek emergency care if very sick or severe symptoms - call 911 -isolate for at least 10 days from the onset of symptoms PLUS 3 days of no fever PLUS 3 days of improving symptoms    Novel Coronavirus Testing:  Positive test. These tests are not 100% perfect, but if you tested positive for COVID-19, this confirms that you have contracted the SARS-CoV-2 virus. STAY HOME to complete full Quarantine per CDC guidelines.  Negative test. These tests are not 100% perfect but if you tested negative for COVID-19, this indicates that you may not have contracted the SARS-CoV-2 virus. Follow your doctor's recommendations and the CDC guidelines.      WORK SLIP:  Please excuse patient Kadyn Chovan,  02-07-1996, from work according to the [facility] guidelines for a COVID like illness. We advise 10 days minimum from the onset of symptoms (08/10/2019) PLUS 3 days of no fever PLUS 3 days of  feeling better.  She/He may work remotely from home in self isolation if she is feeling better and wishes to do so.  Sincerely: E-signature: Dr. Colin Benton, DO [place] Ph: [phone]

## 2019-08-11 NOTE — Patient Instructions (Signed)
Follow up: as needed if symptoms worsen, new concerns arise or you are not improving as expected.   Self Isolation/Home Quarantine: -see the CDC site for information:   RunningShows.co.za.html   -STAY HOME except for to seek medical care -stay in your own room away from others in your house and use a separate bathroom if possible -Wash hands frequently, wear a mask if you leave your room and interact as little as possible with others -seek medical care immediately if worsening - call our office for a visit or call ahead if going elsewhere to an urgent care  -seek emergency care if very sick or severe symptoms - call 911 -isolate for at least 10 days from the onset of symptoms PLUS 3 days of no fever PLUS 3 days of improving symptoms    Novel Coronavirus Testing:  Positive test. These tests are not 100% perfect, but if you tested positive for COVID-19, this confirms that you have contracted the SARS-CoV-2 virus. STAY HOME to complete full Quarantine per CDC guidelines.  Negative test. These tests are not 100% perfect but if you tested negative for COVID-19, this indicates that you may not have contracted the SARS-CoV-2 virus. Follow your doctor's recommendations and the CDC guidelines.      WORK SLIP:  Please excuse patient Holly Abbott,  1996/07/05, from work according to the [facility] guidelines for a COVID like illness. We advise 10 days minimum from the onset of symptoms (08/10/2019) PLUS 3 days of no fever PLUS 3 days of feeling better.  She/He may work remotely from home in self isolation if she is feeling better and wishes to do so.  Sincerely: E-signature: Dr. Colin Benton, DO [place] Ph: [phone]

## 2019-10-05 ENCOUNTER — Other Ambulatory Visit: Payer: Self-pay

## 2019-10-05 DIAGNOSIS — Z20822 Contact with and (suspected) exposure to covid-19: Secondary | ICD-10-CM

## 2019-10-07 LAB — NOVEL CORONAVIRUS, NAA: SARS-CoV-2, NAA: NOT DETECTED

## 2020-01-08 ENCOUNTER — Encounter: Payer: Self-pay | Admitting: Family Medicine

## 2020-01-09 ENCOUNTER — Ambulatory Visit (INDEPENDENT_AMBULATORY_CARE_PROVIDER_SITE_OTHER): Payer: 59 | Admitting: Family Medicine

## 2020-01-09 ENCOUNTER — Encounter: Payer: Self-pay | Admitting: Family Medicine

## 2020-01-09 ENCOUNTER — Other Ambulatory Visit: Payer: Self-pay

## 2020-01-09 VITALS — BP 92/68 | HR 90 | Temp 98.0°F | Ht 64.0 in | Wt 145.8 lb

## 2020-01-09 DIAGNOSIS — R300 Dysuria: Secondary | ICD-10-CM

## 2020-01-09 DIAGNOSIS — R350 Frequency of micturition: Secondary | ICD-10-CM | POA: Diagnosis not present

## 2020-01-09 LAB — POCT URINALYSIS DIPSTICK
Bilirubin, UA: NEGATIVE
Blood, UA: POSITIVE
Glucose, UA: NEGATIVE
Ketones, UA: NEGATIVE
Nitrite, UA: NEGATIVE
Protein, UA: NEGATIVE
Spec Grav, UA: 1.01 (ref 1.010–1.025)
Urobilinogen, UA: 0.2 E.U./dL
pH, UA: 6 (ref 5.0–8.0)

## 2020-01-09 MED ORDER — CEPHALEXIN 500 MG PO CAPS: 500.0000 mg | ORAL_CAPSULE | Freq: Three times a day (TID) | ORAL | 0 refills | Status: DC

## 2020-01-09 NOTE — Patient Instructions (Signed)

## 2020-01-09 NOTE — Progress Notes (Signed)
  Subjective:     Patient ID: Holly Abbott, female   DOB: 01/02/1996, 24 y.o.   MRN: [id number]  HPI   Holly Abbott is seen with 1 day history of some urine frequency and discomfort with urination.  She states that she and her husband are trying to conceive and have been more sexually active over the past week and she wondered if that was related.  She has not had any recent UTIs.  She has some increased frequency.  No gross hematuria.  No fever.  No nausea or vomiting.  No flank pain.  She started some over-the-counter Azo yesterday and that has helped slightly with symptoms.  Denies any vaginal discharge.  Past Medical History:  Diagnosis Date  . Anemia   . Pilonidal cyst   . Scoliosis    Pt known as a child; MD @ bedside when notified    Past Surgical History:  Procedure Laterality Date  . INCISE AND DRAIN ABCESS  2012   pilonidal cyst  . removal of cyst    . WISDOM TOOTH EXTRACTION      reports that she has never smoked. She has never used smokeless tobacco. She reports that she does not drink alcohol or use drugs. family history includes Arthritis in her mother; Asthma in her father and paternal grandmother; Cancer in her paternal grandfather; Diabetes in her maternal grandfather and maternal grandmother. No Known Allergies   Review of Systems  Constitutional: Negative for appetite change, chills and fever.  Gastrointestinal: Negative for abdominal pain, constipation, diarrhea, nausea and vomiting.  Genitourinary: Positive for dysuria and frequency. Negative for flank pain.  Musculoskeletal: Negative for back pain.  Neurological: Negative for dizziness.       Objective:   Physical Exam Vitals reviewed.  Constitutional:      Appearance: Normal appearance.  Cardiovascular:     Rate and Rhythm: Normal rate and regular rhythm.  Pulmonary:     Effort: Pulmonary effort is normal.     Breath sounds: Normal breath sounds.  Neurological:     Mental Status: She is alert.         Assessment:     1 day history of urine frequency and dysuria.  Rule out uncomplicated cystitis.  Urine dipstick reveals leukocytes and blood but no nitrites    Plan:     -We will send urine culture -Start Keflex 500 mg 3 times daily for 5 days pending culture -Plenty of fluids and follow-up promptly for any persistent or worsening symptoms  Kristian Covey MD [facility]

## 2020-01-11 NOTE — Addendum Note (Signed)
Addended by: Philemon Kingdom on: 01/11/2020 07:37 AM   Modules accepted: Orders

## 2020-01-11 NOTE — Addendum Note (Signed)
Addended by: Philemon Kingdom on: 01/11/2020 07:36 AM   Modules accepted: Orders

## 2020-01-13 LAB — URINE CULTURE
MICRO NUMBER:: 10159702
SPECIMEN QUALITY:: ADEQUATE

## 2020-01-25 ENCOUNTER — Telehealth (INDEPENDENT_AMBULATORY_CARE_PROVIDER_SITE_OTHER): Payer: Self-pay | Admitting: Family Medicine

## 2020-01-25 DIAGNOSIS — Z7189 Other specified counseling: Secondary | ICD-10-CM

## 2020-01-25 DIAGNOSIS — J029 Acute pharyngitis, unspecified: Secondary | ICD-10-CM

## 2020-01-25 MED ORDER — AMOXICILLIN 500 MG PO TABS: 500.0000 mg | ORAL_TABLET | Freq: Two times a day (BID) | ORAL | 0 refills | Status: AC

## 2020-01-25 NOTE — Progress Notes (Signed)
Virtual Visit via Telephone Note  I connected with Holly Abbott on 01/25/20 at  4:00 PM EST by telephone and verified that I am speaking with the correct person using two identifiers.   I discussed the limitations, risks, security and privacy concerns of performing an evaluation and management service by telephone and the availability of in person appointments. I also discussed with the patient that there may be a patient responsible charge related to this service. The patient expressed understanding and agreed to proceed.  Location patient: home Location provider: work or home office Participants present for the call: patient, provider Patient did not have a visit in the prior 7 days to address this/these issue(s).   History of Present Illness: Pt with sore throat and sneezing yesterday evening.  Pt endorses worsened symptoms this am and nasal drainage.  Had a HA when symptoms started.  Denies fever, cough, loss of taste or smell, diarrhea, lymphadenopathy, pharyngeal exudate, or sick contacts.   Observations/Objective: Patient sounds cheerful and well on the phone. I do not appreciate any SOB. Speech and thought processing are grossly intact. Patient reported vitals:  Assessment and Plan: Pharyngitis, unspecified etiology  -discussed sx possible 2/2 viral illness vs bacterial.  Cannot r/o COVID-19 -continue supportive care -COVID testing recommended -self quarantine -given precautions - Plan: amoxicillin (AMOXIL) 500 MG tablet  Educated about COVID-19 virus infection -discussed s/s of COVID -COVID testing advised.  Given info on area testing sites. -pt advised to self quarantine   Follow Up Instructions: F/u prn  I did not refer this patient for an OV in the next 24 hours for this/these issue(s).  I discussed the assessment and treatment plan with the patient. The patient was provided an opportunity to ask questions and all were answered. The patient agreed with the  plan and demonstrated an understanding of the instructions.   The patient was advised to call back or seek an in-person evaluation if the symptoms worsen or if the condition fails to improve as anticipated.  I provided 9 minutes of non-face-to-face time during this encounter.   Deeann Saint, MD

## 2020-06-27 ENCOUNTER — Telehealth (INDEPENDENT_AMBULATORY_CARE_PROVIDER_SITE_OTHER): Payer: HRSA Program | Admitting: Family Medicine

## 2020-06-27 ENCOUNTER — Encounter: Payer: Self-pay | Admitting: Family Medicine

## 2020-06-27 DIAGNOSIS — U071 COVID-19: Secondary | ICD-10-CM

## 2020-06-27 DIAGNOSIS — R110 Nausea: Secondary | ICD-10-CM

## 2020-06-27 DIAGNOSIS — J988 Other specified respiratory disorders: Secondary | ICD-10-CM

## 2020-06-27 MED ORDER — ONDANSETRON HCL 4 MG PO TABS: 4.0000 mg | ORAL_TABLET | Freq: Three times a day (TID) | ORAL | 0 refills | Status: DC | PRN

## 2020-06-27 MED ORDER — FLUTICASONE PROPIONATE 50 MCG/ACT NA SUSP: 1.0000 | Freq: Every day | NASAL | 0 refills | Status: DC

## 2020-06-27 NOTE — Progress Notes (Signed)
Virtual Visit via Video Note  I connected with Holly Abbott on 06/27/20 at  1:00 PM EDT by a video enabled telemedicine application 2/2 COVID-19 pandemic and verified that I am speaking with the correct person using two identifiers.  Location patient: home Location provider:work or home office Persons participating in the virtual visit: patient, provider  I discussed the limitations of evaluation and management by telemedicine and the availability of in person appointments. The patient expressed understanding and agreed to proceed.   HPI: Pt is a 24 yo female w/ pmh sig for anemia and scoliosis who was dx'd with COVID last Wednesday.  Started feeling sick on July 20th or 21st.  Had body aches, sore throat.  Tried OTC cold med and zyrtec.  Then developed sneezing, coughing, congestion.  Developed a fever (tmax 101.4 F) 7 days later and lost sense of smell and taste.  Drinking fluids.  Was getting dizzy when trying to take a shower. Having more coughing and SOB at night.  Other family members (husband and 30 yo son) are not sick.    Pt having nausea which started in the last few days.  Pt was about to get a COVID vaccine prior to getting sick.  Pt needs a note for work.  ROS: See pertinent positives and negatives per HPI.  Past Medical History:  Diagnosis Date  . Anemia   . Pilonidal cyst   . Scoliosis    Pt known as a child; MD @ bedside when notified     Past Surgical History:  Procedure Laterality Date  . INCISE AND DRAIN ABCESS  2012   pilonidal cyst  . removal of cyst    . WISDOM TOOTH EXTRACTION      Family History  Problem Relation Age of Onset  . Arthritis Mother   . Asthma Father   . Diabetes Maternal Grandmother   . Diabetes Maternal Grandfather   . Asthma Paternal Grandmother   . Cancer Paternal Grandfather   . Hypertension Neg Hx      Current Outpatient Medications:  .  albuterol (PROVENTIL HFA;VENTOLIN HFA) 108 (90 Base) MCG/ACT inhaler, Inhale 2 puffs into  the lungs every 6 (six) hours as needed., Disp: 1 Inhaler, Rfl: 0 .  cephALEXin (KEFLEX) 500 MG capsule, Take 1 capsule (500 mg total) by mouth 3 (three) times daily., Disp: 15 capsule, Rfl: 0 .  ibuprofen (ADVIL) 800 MG tablet, Take 800 mg by mouth every 8 (eight) hours as needed., Disp: , Rfl:   EXAM:  VITALS per patient if applicable:  RR between 12-20 bpm  GENERAL: alert, oriented, appears well and in no acute distress  HEENT: atraumatic, conjunctiva clear, no obvious abnormalities on inspection of external nose and ears  NECK: normal movements of the head and neck  LUNGS: on inspection no signs of respiratory distress, breathing rate appears normal, no obvious gross SOB, gasping or wheezing  CV: no obvious cyanosis  MS: moves all visible extremities without noticeable abnormality  PSYCH/NEURO: pleasant and cooperative, no obvious depression or anxiety, speech and thought processing grossly intact  ASSESSMENT AND PLAN:  Discussed the following assessment and plan:  Respiratory tract infection due to COVID-19 virus -positive test for COVID-19 last wk. per pt -Discussed supportive care including Tylenol, gargling with warm salt water or Chloraseptic spray, Mucinex for congestion, staying hydrated, rest -We will provide a note for work -Discussed obtaining COVID 19 vaccine after resolution of symptoms -Given precautions for continued or worsening symptoms  Nausea -Plan: Zofran 4  mg as needed  Follow-up as needed.   I discussed the assessment and treatment plan with the patient. The patient was provided an opportunity to ask questions and all were answered. The patient agreed with the plan and demonstrated an understanding of the instructions.   The patient was advised to call back or seek an in-person evaluation if the symptoms worsen or if the condition fails to improve as anticipated.  I provided 14 minutes of non-face-to-face time during this encounter.   Deeann Saint, MD

## 2020-09-12 ENCOUNTER — Encounter: Payer: Self-pay | Admitting: Family Medicine

## 2020-09-12 ENCOUNTER — Telehealth (INDEPENDENT_AMBULATORY_CARE_PROVIDER_SITE_OTHER): Payer: Self-pay | Admitting: Family Medicine

## 2020-09-12 VITALS — Wt 145.0 lb

## 2020-09-12 DIAGNOSIS — Z7189 Other specified counseling: Secondary | ICD-10-CM

## 2020-09-12 DIAGNOSIS — J019 Acute sinusitis, unspecified: Secondary | ICD-10-CM

## 2020-09-12 MED ORDER — AMOXICILLIN 500 MG PO TABS: 500.0000 mg | ORAL_TABLET | Freq: Two times a day (BID) | ORAL | 0 refills | Status: AC

## 2020-09-12 NOTE — Progress Notes (Signed)
Virtual Visit via Video Note Call started via video visit, then pt called to 2/2 audio delay.  I connected with Tamela Gammon on 09/12/20 at  2:00 PM EDT by a video enabled telemedicine application 2/2 COVID-19 pandemic and verified that I am speaking with the correct person using two identifiers.  Location patient: home Location provider:work or home office Persons participating in the virtual visit: patient, provider  I discussed the limitations of evaluation and management by telemedicine and the availability of in person appointments. The patient expressed understanding and agreed to proceed.   HPI: Pt started taking zyrtec last Thursday/friday for pain in her face/between eyes and nose.  Pt then tried psudafed and zyrtec D.  Pt having congestion, rhinorrhea, HAs, sneezing, intermittent coughing, sore throat at night.  Pt had a fever 100.5 F last night.  Took ibuprofen.  Pt denies sick contacts, diarrhea, ear pain or pressure.  Patient has been working from home and mostly staying away from others.  Patient does pick up her child from daycare and is at home with her husband.  Pt had her 2nd COVID vaccine last wk.  Pt had COVID infection in June.   ROS: See pertinent positives and negatives per HPI.  Past Medical History:  Diagnosis Date  . Anemia   . Pilonidal cyst   . Scoliosis    Pt known as a child; MD @ bedside when notified     Past Surgical History:  Procedure Laterality Date  . INCISE AND DRAIN ABCESS  2012   pilonidal cyst  . removal of cyst    . WISDOM TOOTH EXTRACTION      Family History  Problem Relation Age of Onset  . Arthritis Mother   . Asthma Father   . Diabetes Maternal Grandmother   . Diabetes Maternal Grandfather   . Asthma Paternal Grandmother   . Cancer Paternal Grandfather   . Hypertension Neg Hx       Current Outpatient Medications:  .  albuterol (PROVENTIL HFA;VENTOLIN HFA) 108 (90 Base) MCG/ACT inhaler, Inhale 2 puffs into the lungs every 6  (six) hours as needed., Disp: 1 Inhaler, Rfl: 0 .  fluticasone (FLONASE) 50 MCG/ACT nasal spray, Place 1 spray into both nostrils daily., Disp: 16 g, Rfl: 0 .  ibuprofen (ADVIL) 800 MG tablet, Take 800 mg by mouth every 8 (eight) hours as needed., Disp: , Rfl:  .  ondansetron (ZOFRAN) 4 MG tablet, Take 1 tablet (4 mg total) by mouth every 8 (eight) hours as needed for nausea or vomiting., Disp: 20 tablet, Rfl: 0  EXAM:  VITALS per patient if applicable: RR between 12-20 bpm  GENERAL: alert, oriented, appears well and in no acute distress  HEENT: atraumatic, conjunctiva clear, no obvious abnormalities on inspection of external nose and ears  NECK: normal movements of the head and neck  LUNGS: on inspection no signs of respiratory distress, breathing rate appears normal, no obvious gross SOB, gasping or wheezing  CV: no obvious cyanosis  MS: moves all visible extremities without noticeable abnormality  PSYCH/NEURO: pleasant and cooperative, no obvious depression or anxiety, speech and thought processing grossly intact  ASSESSMENT AND PLAN:  Discussed the following assessment and plan:  Acute sinusitis, recurrence not specified, unspecified location  -Discussed symptoms similar to that of viral URI, allergies, and COVID -Discussed supportive care -Will start amoxicillin 500 mg twice daily -Okay to use Flonase -Continue other supportive care - Plan: amoxicillin (AMOXIL) 500 MG tablet  Educated about COVID-19 virus infection -Discussed  signs and symptoms of COVID-19 virus infection -Patient encouraged to consider getting Covid testing despite previous infection and recent vaccination -Self quarantine advised   Follow-up as needed for worsening or continued symptoms.  I discussed the assessment and treatment plan with the patient. The patient was provided an opportunity to ask questions and all were answered. The patient agreed with the plan and demonstrated an understanding of  the instructions.   The patient was advised to call back or seek an in-person evaluation if the symptoms worsen or if the condition fails to improve as anticipated.  I provided 9 minutes of non-face-to-face time during this encounter.   Deeann Saint, MD

## 2020-09-13 DIAGNOSIS — Z20822 Contact with and (suspected) exposure to covid-19: Secondary | ICD-10-CM | POA: Diagnosis not present

## 2020-10-30 ENCOUNTER — Telehealth: Payer: Self-pay | Admitting: Family Medicine

## 2020-10-30 NOTE — Telephone Encounter (Signed)
Patient is calling and wanted to see if it was ok to take Zofran while being pregnant, please advise. CB is [phone]

## 2020-10-30 NOTE — Telephone Encounter (Signed)
Please advise 

## 2020-11-06 DIAGNOSIS — Z3201 Encounter for pregnancy test, result positive: Secondary | ICD-10-CM | POA: Diagnosis not present

## 2020-11-06 DIAGNOSIS — N911 Secondary amenorrhea: Secondary | ICD-10-CM | POA: Diagnosis not present

## 2020-11-08 ENCOUNTER — Encounter: Payer: Self-pay | Admitting: Family Medicine

## 2020-11-20 DIAGNOSIS — U071 COVID-19: Secondary | ICD-10-CM | POA: Diagnosis not present

## 2020-11-21 NOTE — Telephone Encounter (Signed)
It is okay to take Zofran while pregnant if needed.

## 2020-11-22 NOTE — Telephone Encounter (Signed)
Spoke with pt state that she spoke with her OBGYN who advised her on what to do.

## 2020-11-24 NOTE — L&D Delivery Note (Signed)
Delivery Note Pt labored quickly to complete after AROM. She had one prolonged deceleration and was noted to be 8cm. About later she was completely dilated.  She pushed 4-5 times over 10 mins and at 11:15 AM a viable female was delivered via Vaginal, Spontaneous (Presentation:      LOA ).  APGAR: 8,9 ; weight  pending Anterior shoulder delivered with a compound right hand presentation. No nuchal was noted. Posterior shoulder delivered with next push; body easily followed .   Placenta status: Spontaneous, Intact.  Cord: 3 vessels with the following complications: None. Tomasa Blase.  Cord pH: n/a  Anesthesia: Epidural Episiotomy: None Lacerations:  right vaginal first degree laceration  Suture Repair: vicryl 4-0  Est. Blood Loss (mL):    Mom to postpartum.  Baby to Couplet care / Skin to Skin.  Cathrine Muster 06/18/2021, 11:30 AM

## 2020-12-04 DIAGNOSIS — O26891 Other specified pregnancy related conditions, first trimester: Secondary | ICD-10-CM | POA: Diagnosis not present

## 2020-12-04 DIAGNOSIS — Z3A10 10 weeks gestation of pregnancy: Secondary | ICD-10-CM | POA: Diagnosis not present

## 2020-12-04 DIAGNOSIS — Z369 Encounter for antenatal screening, unspecified: Secondary | ICD-10-CM | POA: Diagnosis not present

## 2020-12-04 DIAGNOSIS — Z3A11 11 weeks gestation of pregnancy: Secondary | ICD-10-CM | POA: Diagnosis not present

## 2020-12-04 DIAGNOSIS — Z3481 Encounter for supervision of other normal pregnancy, first trimester: Secondary | ICD-10-CM | POA: Diagnosis not present

## 2020-12-04 DIAGNOSIS — Z113 Encounter for screening for infections with a predominantly sexual mode of transmission: Secondary | ICD-10-CM | POA: Diagnosis not present

## 2020-12-04 LAB — OB RESULTS CONSOLE GC/CHLAMYDIA
Chlamydia: NEGATIVE
Gonorrhea: NEGATIVE

## 2020-12-04 LAB — OB RESULTS CONSOLE HIV ANTIBODY (ROUTINE TESTING): HIV: NONREACTIVE

## 2020-12-04 LAB — OB RESULTS CONSOLE ABO/RH: RH Type: POSITIVE

## 2020-12-04 LAB — OB RESULTS CONSOLE ANTIBODY SCREEN: Antibody Screen: NEGATIVE

## 2020-12-04 LAB — OB RESULTS CONSOLE HEPATITIS B SURFACE ANTIGEN: Hepatitis B Surface Ag: NEGATIVE

## 2020-12-04 LAB — OB RESULTS CONSOLE RUBELLA ANTIBODY, IGM: Rubella: IMMUNE

## 2020-12-04 LAB — OB RESULTS CONSOLE RPR: RPR: NONREACTIVE

## 2020-12-05 DIAGNOSIS — Z1151 Encounter for screening for human papillomavirus (HPV): Secondary | ICD-10-CM | POA: Diagnosis not present

## 2020-12-05 DIAGNOSIS — Z369 Encounter for antenatal screening, unspecified: Secondary | ICD-10-CM | POA: Diagnosis not present

## 2020-12-13 ENCOUNTER — Ambulatory Visit: Payer: Self-pay | Admitting: Family Medicine

## 2020-12-24 ENCOUNTER — Telehealth (INDEPENDENT_AMBULATORY_CARE_PROVIDER_SITE_OTHER): Payer: Self-pay | Admitting: Family Medicine

## 2020-12-24 ENCOUNTER — Encounter: Payer: Self-pay | Admitting: Family Medicine

## 2020-12-24 VITALS — Ht 64.0 in

## 2020-12-24 DIAGNOSIS — R0981 Nasal congestion: Secondary | ICD-10-CM

## 2020-12-24 DIAGNOSIS — J310 Chronic rhinitis: Secondary | ICD-10-CM

## 2020-12-24 DIAGNOSIS — Z3A14 14 weeks gestation of pregnancy: Secondary | ICD-10-CM

## 2020-12-24 MED ORDER — QNASL 80 MCG/ACT NA AERS: 1.0000 | INHALATION_SPRAY | Freq: Every day | NASAL | 0 refills | Status: DC | PRN

## 2020-12-24 NOTE — Progress Notes (Signed)
Virtual Visit via Video Note I connected with Shine on 12/24/20 by a video enabled telemedicine application and verified that I am speaking with the correct person using two identifiers.  Location patient: home Location provider:work office Persons participating in the virtual visit: patient, provider  I discussed the limitations of evaluation and management by telemedicine and the availability of in person appointments. The patient expressed understanding and agreed to proceed.  Chief Complaint  Patient presents with  . Sinus Problem    Ongoing since Friday. No fever, runny nose, headache, sneezing. Sinus pressure, headache. Pain in cheeks.    HPI: Holly Abbott is a otherwise healthy 25 yo female c/o 3 days of frontal and maxillary pressure sensation. + Rhinorrhea and sneezing. No associated visual changes or focal deficit. Stable.  Denies fever,chills,sore throat,anosmia,ageusia,cough wheezing,or SOB. 3 weeks of postnasal drainage, noted a few days after recovering from COVID 19 infection. She has not identified exacerbating or alleviating factors. No known hx of allergic rhinitis/asthma. Frequent visits for respiratory symptoms.  [redacted] weeks pregnant. Her gyn recommended delsym and helped some. Negative for abdominal pain,N/V,changes in bowel habits,or urinary symptoms.  No sick contact or recent travel.  ROS: See pertinent positives and negatives per HPI.  Past Medical History:  Diagnosis Date  . Anemia   . Pilonidal cyst   . Scoliosis    Pt known as a child; MD @ bedside when notified     Past Surgical History:  Procedure Laterality Date  . INCISE AND DRAIN ABCESS  2012   pilonidal cyst  . removal of cyst    . WISDOM TOOTH EXTRACTION      Family History  Problem Relation Age of Onset  . Arthritis Mother   . Asthma Father   . Diabetes Maternal Grandmother   . Diabetes Maternal Grandfather   . Asthma Paternal Grandmother   . Cancer Paternal Grandfather   .  Hypertension Neg Hx     Social History   Socioeconomic History  . Marital status: Married    Spouse name: Not on file  . Number of children: Not on file  . Years of education: Not on file  . Highest education level: Not on file  Occupational History  . Not on file  Tobacco Use  . Smoking status: Never Smoker  . Smokeless tobacco: Never Used  Vaping Use  . Vaping Use: Never used  Substance and Sexual Activity  . Alcohol use: No  . Drug use: No  . Sexual activity: Yes  Other Topics Concern  . Not on file  Social History Narrative   ** Merged History Encounter **       Social Determinants of Health   Financial Resource Strain: Not on file  Food Insecurity: Not on file  Transportation Needs: Not on file  Physical Activity: Not on file  Stress: Not on file  Social Connections: Not on file  Intimate Partner Violence: Not on file   Current Outpatient Medications:  .  albuterol (PROVENTIL HFA;VENTOLIN HFA) 108 (90 Base) MCG/ACT inhaler, Inhale 2 puffs into the lungs every 6 (six) hours as needed., Disp: 1 Inhaler, Rfl: 0 .  Beclomethasone Dipropionate (QNASL) 80 MCG/ACT AERS, Place 1 puff into the nose daily as needed., Disp: 8.7 g, Rfl: 0 .  ondansetron (ZOFRAN) 4 MG tablet, Take 1 tablet (4 mg total) by mouth every 8 (eight) hours as needed for nausea or vomiting., Disp: 20 tablet, Rfl: 0  EXAM:  VITALS per patient if applicable:Ht 5\' 4"  (1.626 m)  BMI 24.89 kg/m   GENERAL: alert, oriented, appears well and in no acute distress  HEENT: atraumatic, conjunctiva clear, no obvious abnormalities on inspection of external nose and ears  NECK: normal movements of the head and neck  LUNGS: on inspection no signs of respiratory distress, breathing rate appears normal, no obvious gross SOB, gasping or wheezing  CV: no obvious cyanosis  MS: moves all visible extremities without noticeable abnormality  PSYCH/NEURO: pleasant and cooperative, no obvious depression or  anxiety, speech and thought processing grossly intact  ASSESSMENT AND PLAN:  Discussed the following assessment and plan:  Sinus congestion Possible etiologies discussed. Hx does not suggest an infectious process. I do not think abx is needed at this time. Because pregnancy she has limitations in regard to decongestants use. Monitor for new or worsening symptoms.  Rhinitis, unspecified type - Plan: Beclomethasone Dipropionate (QNASL) 80 MCG/ACT AERS Most of intranasal steroids are contraindicated during pregnancy. Nasal saline irrigations as needed. If needed for more severe symptoms she can try Anals.  [redacted] weeks gestation of pregnancy We discussed some of changes during pregnancy, some of her above complaints may get worse as pregnancy advance.  I prefer non pharmacologic treatments as possible. Instructed to call her ob-gyn to let her know about medication added today.  We discussed possible serious and likely etiologies, options for evaluation and workup, limitations of telemedicine visit vs in person visit, treatment, treatment risks and precautions. Holly Abbott was advised to call back or seek an in-person evaluation if the symptoms worsen or if the condition fails to improve as anticipated. I discussed the assessment and treatment plan with the patient. Holly Abbott was provided an opportunity to ask questions and all were answered. She agreed with the plan and demonstrated an understanding of the instructions.  Return if symptoms worsen or fail to improve.   Holly Abbott [place], MD

## 2020-12-25 ENCOUNTER — Telehealth: Payer: Self-pay | Admitting: Family Medicine

## 2020-12-25 NOTE — Telephone Encounter (Signed)
Mometasone (Spry), PA Not Required Flunisolide 25 Mcg (0.025 %) Spry, PA Not Required Azelastine-Fluticasone, PA Required

## 2020-12-25 NOTE — Telephone Encounter (Signed)
I started the PA via covermymeds.

## 2020-12-25 NOTE — Telephone Encounter (Signed)
The patient had a virtual visit with Dr. [place] 12/24/2020 and Dr. [place] prescribed her Beclomethasone Dipropionate (QNASL) 80 MCG/ACT AERS  Her insurance does not cover this Rx and she wants to know if there is an alternative that the insurance will cover.  [id number] [place], [place] - [address] AT [facility] OF [address] Phone:  [phone]  Fax:  [phone]

## 2020-12-25 NOTE — Telephone Encounter (Signed)
Because she is pregnant this is the safest one for her. She can try just nasal irrigations with saline and see if this alone helps. Thanks, BJ

## 2020-12-26 NOTE — Telephone Encounter (Signed)
I spoke with pt. She is aware we started the PA, will use saline spray in the meantime.

## 2020-12-28 NOTE — Telephone Encounter (Signed)
Per insurance, PA not needed. Form faxed back to pharmacy.

## 2021-03-25 DIAGNOSIS — Z3689 Encounter for other specified antenatal screening: Secondary | ICD-10-CM | POA: Diagnosis not present

## 2021-03-25 DIAGNOSIS — Z23 Encounter for immunization: Secondary | ICD-10-CM | POA: Diagnosis not present

## 2021-03-25 DIAGNOSIS — Z3A27 27 weeks gestation of pregnancy: Secondary | ICD-10-CM | POA: Diagnosis not present

## 2021-03-25 LAB — OB RESULTS CONSOLE RPR: RPR: NONREACTIVE

## 2021-04-16 ENCOUNTER — Encounter: Payer: Self-pay | Admitting: Family Medicine

## 2021-05-20 DIAGNOSIS — R42 Dizziness and giddiness: Secondary | ICD-10-CM | POA: Diagnosis not present

## 2021-05-20 DIAGNOSIS — R55 Syncope and collapse: Secondary | ICD-10-CM | POA: Diagnosis not present

## 2021-05-20 DIAGNOSIS — Z3A35 35 weeks gestation of pregnancy: Secondary | ICD-10-CM | POA: Diagnosis not present

## 2021-05-20 DIAGNOSIS — O26812 Pregnancy related exhaustion and fatigue, second trimester: Secondary | ICD-10-CM | POA: Diagnosis not present

## 2021-05-20 DIAGNOSIS — Z3483 Encounter for supervision of other normal pregnancy, third trimester: Secondary | ICD-10-CM | POA: Diagnosis not present

## 2021-05-28 DIAGNOSIS — Z3483 Encounter for supervision of other normal pregnancy, third trimester: Secondary | ICD-10-CM | POA: Diagnosis not present

## 2021-05-28 DIAGNOSIS — Z3A35 35 weeks gestation of pregnancy: Secondary | ICD-10-CM | POA: Diagnosis not present

## 2021-05-28 DIAGNOSIS — O26812 Pregnancy related exhaustion and fatigue, second trimester: Secondary | ICD-10-CM | POA: Diagnosis not present

## 2021-05-28 DIAGNOSIS — Z3482 Encounter for supervision of other normal pregnancy, second trimester: Secondary | ICD-10-CM | POA: Diagnosis not present

## 2021-05-28 DIAGNOSIS — Z3685 Encounter for antenatal screening for Streptococcus B: Secondary | ICD-10-CM | POA: Diagnosis not present

## 2021-05-28 DIAGNOSIS — Z3A36 36 weeks gestation of pregnancy: Secondary | ICD-10-CM | POA: Diagnosis not present

## 2021-06-07 DIAGNOSIS — Z3685 Encounter for antenatal screening for Streptococcus B: Secondary | ICD-10-CM | POA: Diagnosis not present

## 2021-06-07 DIAGNOSIS — O26812 Pregnancy related exhaustion and fatigue, second trimester: Secondary | ICD-10-CM | POA: Diagnosis not present

## 2021-06-07 DIAGNOSIS — Z3A37 37 weeks gestation of pregnancy: Secondary | ICD-10-CM | POA: Diagnosis not present

## 2021-06-07 DIAGNOSIS — O2693 Pregnancy related conditions, unspecified, third trimester: Secondary | ICD-10-CM | POA: Diagnosis not present

## 2021-06-07 DIAGNOSIS — Z3483 Encounter for supervision of other normal pregnancy, third trimester: Secondary | ICD-10-CM | POA: Diagnosis not present

## 2021-06-07 LAB — OB RESULTS CONSOLE GBS: GBS: NEGATIVE

## 2021-06-11 ENCOUNTER — Telehealth (HOSPITAL_COMMUNITY): Payer: Self-pay | Admitting: *Deleted

## 2021-06-11 ENCOUNTER — Encounter (HOSPITAL_COMMUNITY): Payer: Self-pay | Admitting: *Deleted

## 2021-06-11 NOTE — Telephone Encounter (Signed)
Preadmission screen  

## 2021-06-12 ENCOUNTER — Encounter (HOSPITAL_COMMUNITY): Payer: Self-pay | Admitting: *Deleted

## 2021-06-13 DIAGNOSIS — Z3A38 38 weeks gestation of pregnancy: Secondary | ICD-10-CM | POA: Diagnosis not present

## 2021-06-13 DIAGNOSIS — O26812 Pregnancy related exhaustion and fatigue, second trimester: Secondary | ICD-10-CM | POA: Diagnosis not present

## 2021-06-17 ENCOUNTER — Other Ambulatory Visit (HOSPITAL_COMMUNITY)
Admission: RE | Admit: 2021-06-17 | Discharge: 2021-06-17 | Disposition: A | Discharge status: Home / Self Care | Payer: 59 | Source: Ambulatory Visit | Attending: Obstetrics and Gynecology | Admitting: Obstetrics and Gynecology

## 2021-06-17 DIAGNOSIS — Z20822 Contact with and (suspected) exposure to covid-19: Secondary | ICD-10-CM | POA: Diagnosis not present

## 2021-06-17 DIAGNOSIS — O26893 Other specified pregnancy related conditions, third trimester: Secondary | ICD-10-CM | POA: Diagnosis not present

## 2021-06-17 DIAGNOSIS — Z3A39 39 weeks gestation of pregnancy: Secondary | ICD-10-CM | POA: Diagnosis not present

## 2021-06-17 DIAGNOSIS — Z01812 Encounter for preprocedural laboratory examination: Secondary | ICD-10-CM | POA: Insufficient documentation

## 2021-06-17 DIAGNOSIS — Z3490 Encounter for supervision of normal pregnancy, unspecified, unspecified trimester: Secondary | ICD-10-CM | POA: Diagnosis not present

## 2021-06-17 DIAGNOSIS — O322XX0 Maternal care for transverse and oblique lie, not applicable or unspecified: Secondary | ICD-10-CM | POA: Diagnosis not present

## 2021-06-17 LAB — SARS CORONAVIRUS 2 (TAT 6-24 HRS): SARS Coronavirus 2: NEGATIVE

## 2021-06-18 ENCOUNTER — Inpatient Hospital Stay (HOSPITAL_COMMUNITY): Payer: 59 | Admitting: Anesthesiology

## 2021-06-18 ENCOUNTER — Inpatient Hospital Stay (HOSPITAL_COMMUNITY)
Admission: AD | Admit: 2021-06-18 | Discharge: 2021-06-20 | DRG: 807 | Disposition: A | Discharge status: Home / Self Care | Payer: 59 | Attending: Obstetrics and Gynecology | Admitting: Obstetrics and Gynecology

## 2021-06-18 ENCOUNTER — Encounter (HOSPITAL_COMMUNITY): Payer: Self-pay | Admitting: Obstetrics and Gynecology

## 2021-06-18 ENCOUNTER — Inpatient Hospital Stay (HOSPITAL_COMMUNITY): Payer: 59

## 2021-06-18 ENCOUNTER — Other Ambulatory Visit: Payer: Self-pay

## 2021-06-18 DIAGNOSIS — Z20822 Contact with and (suspected) exposure to covid-19: Secondary | ICD-10-CM | POA: Diagnosis present

## 2021-06-18 DIAGNOSIS — O26893 Other specified pregnancy related conditions, third trimester: Secondary | ICD-10-CM | POA: Diagnosis present

## 2021-06-18 DIAGNOSIS — Z3A39 39 weeks gestation of pregnancy: Secondary | ICD-10-CM | POA: Diagnosis not present

## 2021-06-18 DIAGNOSIS — O322XX0 Maternal care for transverse and oblique lie, not applicable or unspecified: Secondary | ICD-10-CM | POA: Diagnosis present

## 2021-06-18 DIAGNOSIS — Z3490 Encounter for supervision of normal pregnancy, unspecified, unspecified trimester: Secondary | ICD-10-CM | POA: Diagnosis not present

## 2021-06-18 LAB — TYPE AND SCREEN
ABO/RH(D): B POS
Antibody Screen: NEGATIVE

## 2021-06-18 LAB — CBC
HCT: 35.4 % — ABNORMAL LOW (ref 36.0–46.0)
Hemoglobin: 11.5 g/dL — ABNORMAL LOW (ref 12.0–15.0)
MCH: 30.5 pg (ref 26.0–34.0)
MCHC: 32.5 g/dL (ref 30.0–36.0)
MCV: 93.9 fL (ref 80.0–100.0)
Platelets: 271 10*3/uL (ref 150–400)
RBC: 3.77 MIL/uL — ABNORMAL LOW (ref 3.87–5.11)
RDW: 15.8 % — ABNORMAL HIGH (ref 11.5–15.5)
WBC: 9.8 10*3/uL (ref 4.0–10.5)
nRBC: 0 % (ref 0.0–0.2)

## 2021-06-18 LAB — RPR: RPR Ser Ql: NONREACTIVE

## 2021-06-18 MED ORDER — SIMETHICONE 80 MG PO CHEW: 80.0000 mg | CHEWABLE_TABLET | ORAL | Status: DC | PRN

## 2021-06-18 MED ORDER — EPHEDRINE 5 MG/ML INJ: 10.0000 mg | INTRAVENOUS | Status: DC | PRN

## 2021-06-18 MED ORDER — TERBUTALINE SULFATE 1 MG/ML IJ SOLN: 0.2500 mg | Freq: Once | INTRAMUSCULAR | Status: DC | PRN

## 2021-06-18 MED ORDER — SOD CITRATE-CITRIC ACID 500-334 MG/5ML PO SOLN: 30.0000 mL | ORAL | Status: DC | PRN

## 2021-06-18 MED ORDER — LACTATED RINGERS IV SOLN: INTRAVENOUS | Status: DC

## 2021-06-18 MED ORDER — OXYTOCIN-SODIUM CHLORIDE 30-0.9 UT/500ML-% IV SOLN
2.5000 [IU]/h | INTRAVENOUS | Status: DC
  Administered 2021-06-18: 2.5 [IU]/h via INTRAVENOUS

## 2021-06-18 MED ORDER — ONDANSETRON HCL 4 MG/2ML IJ SOLN: 4.0000 mg | INTRAMUSCULAR | Status: DC | PRN

## 2021-06-18 MED ORDER — PHENYLEPHRINE 40 MCG/ML (10ML) SYRINGE FOR IV PUSH (FOR BLOOD PRESSURE SUPPORT): 80.0000 ug | PREFILLED_SYRINGE | INTRAVENOUS | Status: DC | PRN

## 2021-06-18 MED ORDER — DIPHENHYDRAMINE HCL 25 MG PO CAPS: 25.0000 mg | ORAL_CAPSULE | Freq: Four times a day (QID) | ORAL | Status: DC | PRN

## 2021-06-18 MED ORDER — PHENYLEPHRINE 40 MCG/ML (10ML) SYRINGE FOR IV PUSH (FOR BLOOD PRESSURE SUPPORT)
80.0000 ug | PREFILLED_SYRINGE | INTRAVENOUS | Status: DC | PRN
  Filled 2021-06-18: qty 10

## 2021-06-18 MED ORDER — PRENATAL MULTIVITAMIN CH
1.0000 | ORAL_TABLET | Freq: Every day | ORAL | Status: DC
  Administered 2021-06-19: 1 via ORAL
  Filled 2021-06-18: qty 1

## 2021-06-18 MED ORDER — DIPHENHYDRAMINE HCL 50 MG/ML IJ SOLN: 12.5000 mg | INTRAMUSCULAR | Status: DC | PRN

## 2021-06-18 MED ORDER — ZOLPIDEM TARTRATE 5 MG PO TABS: 5.0000 mg | ORAL_TABLET | Freq: Every evening | ORAL | Status: DC | PRN

## 2021-06-18 MED ORDER — WITCH HAZEL-GLYCERIN EX PADS: 1.0000 "application " | MEDICATED_PAD | CUTANEOUS | Status: DC | PRN

## 2021-06-18 MED ORDER — IBUPROFEN 600 MG PO TABS
600.0000 mg | ORAL_TABLET | Freq: Four times a day (QID) | ORAL | Status: DC
  Administered 2021-06-18 – 2021-06-20 (×7): 600 mg via ORAL
  Filled 2021-06-18 (×7): qty 1

## 2021-06-18 MED ORDER — OXYCODONE-ACETAMINOPHEN 5-325 MG PO TABS: 2.0000 | ORAL_TABLET | ORAL | Status: DC | PRN

## 2021-06-18 MED ORDER — COCONUT OIL OIL: 1.0000 "application " | TOPICAL_OIL | Status: DC | PRN

## 2021-06-18 MED ORDER — FENTANYL-BUPIVACAINE-NACL 0.5-0.125-0.9 MG/250ML-% EP SOLN
12.0000 mL/h | EPIDURAL | Status: DC | PRN
  Administered 2021-06-18: 12 mL/h via EPIDURAL
  Filled 2021-06-18: qty 250

## 2021-06-18 MED ORDER — OXYCODONE HCL 5 MG PO TABS: 10.0000 mg | ORAL_TABLET | ORAL | Status: DC | PRN

## 2021-06-18 MED ORDER — ONDANSETRON HCL 4 MG/2ML IJ SOLN
4.0000 mg | Freq: Four times a day (QID) | INTRAMUSCULAR | Status: DC | PRN
  Administered 2021-06-18: 4 mg via INTRAVENOUS
  Filled 2021-06-18: qty 2

## 2021-06-18 MED ORDER — LIDOCAINE-EPINEPHRINE (PF) 2 %-1:200000 IJ SOLN
INTRAMUSCULAR | Status: DC | PRN
  Administered 2021-06-18: 5 mL via EPIDURAL

## 2021-06-18 MED ORDER — TETANUS-DIPHTH-ACELL PERTUSSIS 5-2.5-18.5 LF-MCG/0.5 IM SUSY: 0.5000 mL | PREFILLED_SYRINGE | Freq: Once | INTRAMUSCULAR | Status: DC

## 2021-06-18 MED ORDER — BENZOCAINE-MENTHOL 20-0.5 % EX AERO: 1.0000 "application " | INHALATION_SPRAY | CUTANEOUS | Status: DC | PRN

## 2021-06-18 MED ORDER — OXYCODONE-ACETAMINOPHEN 5-325 MG PO TABS
1.0000 | ORAL_TABLET | ORAL | Status: DC | PRN
Start: 2021-06-18 — End: 2021-06-18

## 2021-06-18 MED ORDER — ACETAMINOPHEN 325 MG PO TABS: 650.0000 mg | ORAL_TABLET | ORAL | Status: DC | PRN

## 2021-06-18 MED ORDER — OXYTOCIN-SODIUM CHLORIDE 30-0.9 UT/500ML-% IV SOLN
1.0000 m[IU]/min | INTRAVENOUS | Status: DC
  Administered 2021-06-18: 2 m[IU]/min via INTRAVENOUS
  Filled 2021-06-18: qty 500

## 2021-06-18 MED ORDER — FLEET ENEMA 7-19 GM/118ML RE ENEM: 1.0000 | ENEMA | RECTAL | Status: DC | PRN

## 2021-06-18 MED ORDER — SENNOSIDES-DOCUSATE SODIUM 8.6-50 MG PO TABS
2.0000 | ORAL_TABLET | Freq: Every day | ORAL | Status: DC
  Administered 2021-06-19: 2 via ORAL
  Filled 2021-06-18: qty 2

## 2021-06-18 MED ORDER — LIDOCAINE HCL (PF) 1 % IJ SOLN: 30.0000 mL | INTRAMUSCULAR | Status: DC | PRN

## 2021-06-18 MED ORDER — LACTATED RINGERS IV SOLN: 500.0000 mL | INTRAVENOUS | Status: DC | PRN

## 2021-06-18 MED ORDER — FENTANYL CITRATE (PF) 100 MCG/2ML IJ SOLN
INTRAMUSCULAR | Status: AC
  Administered 2021-06-18: 100 ug via INTRAVENOUS
  Filled 2021-06-18: qty 2

## 2021-06-18 MED ORDER — DIBUCAINE (PERIANAL) 1 % EX OINT: 1.0000 "application " | TOPICAL_OINTMENT | CUTANEOUS | Status: DC | PRN

## 2021-06-18 MED ORDER — LACTATED RINGERS IV SOLN
500.0000 mL | Freq: Once | INTRAVENOUS | Status: AC
  Administered 2021-06-18: 500 mL via INTRAVENOUS

## 2021-06-18 MED ORDER — ONDANSETRON HCL 4 MG PO TABS: 4.0000 mg | ORAL_TABLET | ORAL | Status: DC | PRN

## 2021-06-18 MED ORDER — OXYTOCIN BOLUS FROM INFUSION
333.0000 mL | Freq: Once | INTRAVENOUS | Status: AC
  Administered 2021-06-18: 333 mL via INTRAVENOUS

## 2021-06-18 MED ORDER — FENTANYL CITRATE (PF) 100 MCG/2ML IJ SOLN: 100.0000 ug | Freq: Once | INTRAMUSCULAR | Status: AC

## 2021-06-18 MED ORDER — OXYCODONE HCL 5 MG PO TABS: 5.0000 mg | ORAL_TABLET | ORAL | Status: DC | PRN

## 2021-06-18 MED ORDER — MISOPROSTOL 25 MCG QUARTER TABLET: 25.0000 ug | ORAL_TABLET | ORAL | Status: DC | PRN

## 2021-06-18 NOTE — Anesthesia Preprocedure Evaluation (Signed)
Anesthesia Evaluation  Patient identified by MRN, date of birth, ID band Patient awake    Reviewed: Allergy & Precautions, NPO status , Patient's Chart, lab work & pertinent test results  Airway Mallampati: II  TM Distance: >3 FB Neck ROM: Full    Dental no notable dental hx.    Pulmonary neg pulmonary ROS,    Pulmonary exam normal breath sounds clear to auscultation       Cardiovascular negative cardio ROS Normal cardiovascular exam Rhythm:Regular Rate:Normal     Neuro/Psych negative neurological ROS  negative psych ROS   GI/Hepatic negative GI ROS, Neg liver ROS,   Endo/Other  negative endocrine ROS  Renal/GU negative Renal ROS  negative genitourinary   Musculoskeletal negative musculoskeletal ROS (+)   Abdominal   Peds  Hematology negative hematology ROS (+)   Anesthesia Other Findings   Reproductive/Obstetrics (+) Pregnancy                             Anesthesia Physical Anesthesia Plan  ASA: 2  Anesthesia Plan: Epidural   Post-op Pain Management:    Induction:   PONV Risk Score and Plan: Treatment may vary due to age or medical condition  Airway Management Planned: Natural Airway  Additional Equipment:   Intra-op Plan:   Post-operative Plan:   Informed Consent: I have reviewed the patients History and Physical, chart, labs and discussed the procedure including the risks, benefits and alternatives for the proposed anesthesia with the patient or authorized representative who has indicated his/her understanding and acceptance.       Plan Discussed with: Anesthesiologist  Anesthesia Plan Comments: (Patient identified. Risks, benefits, options discussed with patient including but not limited to bleeding, infection, nerve damage, paralysis, failed block, incomplete pain control, headache, blood pressure changes, nausea, vomiting, reactions to medication, itching, and  post partum back pain. Confirmed with bedside nurse the patient's most recent platelet count. Confirmed with the patient that they are not taking any anticoagulation, have any bleeding history or any family history of bleeding disorders. Patient expressed understanding and wishes to proceed. All questions were answered. )        Anesthesia Quick Evaluation  

## 2021-06-18 NOTE — H&P (Addendum)
Holly Abbott is a 25 y.o.G2P1001  female presenting for elective IOL at 53 1/7wks. Pt is dated per 11week [place]. Her prenatal care was complicated with unexplained syncopal episodes at 35-37weeks. Weekly BPPs were wnl She was borderline anemic,  has a history of asthma, and preE and is RNI.  She declined genetic screening  OB History     Gravida  2   Para  1   Term  1   Preterm  0   AB  0   Living  1      SAB  0   IAB  0   Ectopic  0   Multiple      Live Births  1          Past Medical History:  Diagnosis Date   Anemia    History of pre-eclampsia    Pilonidal cyst    Scoliosis    Pt known as a child; MD @ bedside when notified    Past Surgical History:  Procedure Laterality Date   INCISE AND DRAIN ABCESS  2012   pilonidal cyst   removal of cyst     WISDOM TOOTH EXTRACTION     Family History: family history includes Arthritis in her mother; Asthma in her father and paternal grandmother; Cancer in her paternal grandfather; Diabetes in her maternal grandfather and maternal grandmother; Multiple sclerosis in her mother. Social History:  reports that she has never smoked. She has never used smokeless tobacco. She reports that she does not drink alcohol and does not use drugs.     Maternal Diabetes: No Genetic Screening: Declined Maternal Ultrasounds/Referrals: Normal Fetal Ultrasounds or other Referrals:  None Maternal Substance Abuse:  No Significant Maternal Medications:  None Significant Maternal Lab Results:  Group B Strep negative Other Comments:  None  Review of Systems  Constitutional:  Negative for activity change and fatigue.  Eyes:  Negative for photophobia and visual disturbance.  Respiratory:  Negative for chest tightness and shortness of breath.   Cardiovascular:  Negative for chest pain, palpitations and leg swelling.  Gastrointestinal:  Negative for abdominal pain.  Genitourinary:  Negative for pelvic pain.  Musculoskeletal:  Negative  for back pain.  Neurological:  Positive for syncope. Negative for light-headedness.  Psychiatric/Behavioral:  The patient is nervous/anxious.   Maternal Medical History:  Fetal activity: Perceived fetal activity is normal.   Prenatal complications: no prenatal complications Prenatal Complications - Diabetes: none.  Dilation: 5 Effacement (%): 80 Station: -3 Exam by:: Holly Lucks Adcox, RN Blood pressure 105/86, pulse 78, temperature 98.6 F (37 C), temperature source Oral, resp. rate 18, height 5\' 4"  (1.626 m), weight 78.5 kg, SpO2 100 %. Maternal Exam:  Uterine Assessment: Contraction strength is mild.  Contraction frequency is rare.  Abdomen: Patient reports generalized tenderness.  Estimated fetal weight is AGA.   Fetal presentation: vertex Introitus: Normal vulva. Vulva is negative for condylomata and lesion.  Normal vagina.  Vagina is negative for condylomata.  Pelvis: adequate for delivery.   Cervix: Cervix evaluated by digital exam.     Fetal Exam Fetal Monitor Review: Baseline rate: 120.  Variability: moderate (6-25 bpm).   Pattern: accelerations present and no decelerations.   Fetal State Assessment: Category I - tracings are normal.  Physical Exam Vitals and nursing note reviewed. Exam conducted with a chaperone present.  Constitutional:      Appearance: Normal appearance.  Cardiovascular:     Pulses: Normal pulses.  Pulmonary:     Effort: Pulmonary effort  is normal.  Abdominal:     Tenderness: There is generalized abdominal tenderness.  Genitourinary:    General: Normal vulva.  Vulva is no lesion.  Musculoskeletal:        General: Normal range of motion.     Cervical back: Normal range of motion.  Skin:    General: Skin is warm and dry.     Capillary Refill: Capillary refill takes 2 to 3 seconds.  Neurological:     General: No focal deficit present.     Mental Status: She is alert and oriented to person, place, and time. Mental status is at baseline.   Psychiatric:        Mood and Affect: Mood normal.        Behavior: Behavior normal.        Thought Content: Thought content normal.        Judgment: Judgment normal.    Prenatal labs: ABO, Rh: --/--/B POS (07/26 0020) Antibody: NEG (07/26 0020) Rubella: Immune (01/11 0000) RPR: Nonreactive (05/02 0000)  HBsAg: Negative (01/11 0000)  HIV: Non-reactive (01/11 0000)  GBS: Negative/-- (07/15 0000)   Assessment/Plan: 76 female here for elective IOL at 31 1/7wks - stable -- Admitted - S/P cytotec now on of pitocin - AROM performed with clear copious fluid noted; 5.5/80/-2 - S/P epidural  - GBS neg - Covid neg - Anticipate svd    Holly Abbott W Holly Abbott 06/18/2021, 9:39 AM

## 2021-06-18 NOTE — Anesthesia Postprocedure Evaluation (Signed)
Anesthesia Post Note  Patient: Holly Abbott  Procedure(s) Performed: AN AD HOC LABOR EPIDURAL     Patient location during evaluation: Mother Baby Anesthesia Type: Epidural Level of consciousness: awake and alert Pain management: pain level controlled Vital Signs Assessment: post-procedure vital signs reviewed and stable Respiratory status: spontaneous breathing, nonlabored ventilation and respiratory function stable Cardiovascular status: stable Postop Assessment: no headache, no backache and epidural receding Anesthetic complications: no   No notable events documented.  Last Vitals:  Vitals:   06/18/21 1432 06/18/21 1742  BP: 99/63 111/78  Pulse: 68 74  Resp: 17 18  Temp: 36.6 C 36.9 C  SpO2:  100%    Last Pain:  Vitals:   06/18/21 1742  TempSrc: Oral  PainSc: 6    Pain Goal: Patients Stated Pain Goal: 3 (06/18/21 1742)                 Blythe Stanford

## 2021-06-18 NOTE — Anesthesia Procedure Notes (Signed)
Epidural Patient location during procedure: OB Start time: 06/18/2021 8:30 AM End time: 06/18/2021 8:40 AM  Staffing Anesthesiologist: Elmer Picker, MD Performed: anesthesiologist   Preanesthetic Checklist Completed: patient identified, IV checked, risks and benefits discussed, monitors and equipment checked, pre-op evaluation and timeout performed  Epidural Patient position: sitting Prep: DuraPrep and site prepped and draped Patient monitoring: continuous pulse ox, blood pressure, heart rate and cardiac monitor Approach: midline Location: L3-L4 Injection technique: LOR air  Needle:  Needle type: Tuohy  Needle gauge: 17 G Needle length: 9 cm Needle insertion depth: 5 cm Catheter type: closed end flexible Catheter size: 19 Gauge Catheter at skin depth: 10 cm Test dose: negative  Assessment Sensory level: T8 Events: blood not aspirated, injection not painful, no injection resistance, no paresthesia and negative IV test  Additional Notes Patient identified. Risks/Benefits/Options discussed with patient including but not limited to bleeding, infection, nerve damage, paralysis, failed block, incomplete pain control, headache, blood pressure changes, nausea, vomiting, reactions to medication both or allergic, itching and postpartum back pain. Confirmed with bedside nurse the patient's most recent platelet count. Confirmed with patient that they are not currently taking any anticoagulation, have any bleeding history or any family history of bleeding disorders. Patient expressed understanding and wished to proceed. All questions were answered. Sterile technique was used throughout the entire procedure. Please see nursing notes for vital signs. Test dose was given through epidural catheter and negative prior to continuing to dose epidural or start infusion. Warning signs of high block given to the patient including shortness of breath, tingling/numbness in hands, complete motor block,  or any concerning symptoms with instructions to call for help. Patient was given instructions on fall risk and not to get out of bed. All questions and concerns addressed with instructions to call with any issues or inadequate analgesia.  Reason for block:procedure for pain

## 2021-06-18 NOTE — Lactation Note (Signed)
This note was copied from a baby's chart. Lactation Consultation Note  Patient Name: Girl Jaylenne Hamelin [id number] Date: 06/18/2021 Reason for consult: L&D Initial assessment;Term;Other (Comment) (2nd latch / with swallows / depth increased / and per mom comfortable.) Age: LC visit in L/D at 35 mins pp / P 2 experienced BF of 1 year.  Baby STS on moms chest/ LC offered to assist to latch / baby's 1st attempt 3  mins few swallows and released. 2nd attempt improved depth/swallows / and sustained a latch for 19 mins / nipple well rounded when baby released / per mom comfortable - Latch score 8. Mom aware she will be seen again today by LC.    Maternal Data - mom reports + breast changes 1st trimester     Feeding Mother's Current Feeding Choice: Breast Milk  LATCH Score Latch: Grasps breast easily, tongue down, lips flanged, rhythmical sucking.  Audible Swallowing: A few with stimulation (increased swallows)  Type of Nipple: Everted at rest and after stimulation  Comfort (Breast/Nipple): Soft / non-tender  Hold (Positioning): Assistance needed to correctly position infant at breast and maintain latch.  LATCH Score: 8   Lactation Tools Discussed/Used    Interventions Interventions: Breast feeding basics reviewed;Assisted with latch;Skin to skin;Breast compression;Adjust position;Position options;Education  Discharge    Consult Status Consult Status: Follow-up (from LD) Date: 06/18/21 Follow-up type: In-patient    Matilde Sprang Pattye Meda 06/18/2021, 12:13 PM

## 2021-06-19 LAB — CBC
HCT: 32.1 % — ABNORMAL LOW (ref 36.0–46.0)
Hemoglobin: 10.5 g/dL — ABNORMAL LOW (ref 12.0–15.0)
MCH: 30.6 pg (ref 26.0–34.0)
MCHC: 32.7 g/dL (ref 30.0–36.0)
MCV: 93.6 fL (ref 80.0–100.0)
Platelets: 218 10*3/uL (ref 150–400)
RBC: 3.43 MIL/uL — ABNORMAL LOW (ref 3.87–5.11)
RDW: 15.9 % — ABNORMAL HIGH (ref 11.5–15.5)
WBC: 11.1 10*3/uL — ABNORMAL HIGH (ref 4.0–10.5)
nRBC: 0 % (ref 0.0–0.2)

## 2021-06-19 MED ORDER — IBUPROFEN 600 MG PO TABS: 600.0000 mg | ORAL_TABLET | Freq: Four times a day (QID) | ORAL | 0 refills | Status: DC

## 2021-06-19 MED ORDER — ACETAMINOPHEN 325 MG PO TABS: 650.0000 mg | ORAL_TABLET | ORAL | 1 refills | Status: DC | PRN

## 2021-06-19 NOTE — Progress Notes (Signed)
Post Partum Day 1 Subjective: no complaints, up ad lib, and voiding  She would like early d/c for her son's birthday today  Objective: Blood pressure 104/69, pulse 70, temperature 98.6 F (37 C), temperature source Oral, resp. rate 18, height 5\' 4"  (1.626 m), weight 78.5 kg, SpO2 99 %, unknown if currently breastfeeding.  Physical Exam:  General: alert and cooperative Lochia: appropriate Uterine Fundus: firm   Recent Labs    06/18/21 0018 06/19/21 0538  HGB 11.5* 10.5*  HCT 35.4* 32.1*    Assessment/Plan: Discharge home if baby able to go    LOS: 1 day   06/21/21 06/19/2021, 10:00 AM

## 2021-06-19 NOTE — Plan of Care (Signed)
  Problem: Education: Goal: Knowledge of General Education information will improve Description: Including pain rating scale, medication(s)/side effects and non-pharmacologic comfort measures Outcome: Progressing   Problem: Health Behavior/Discharge Planning: Goal: Ability to manage health-related needs will improve Outcome: Progressing   Problem: Clinical Measurements: Goal: Ability to maintain clinical measurements within normal limits will improve Outcome: Progressing Goal: Will remain free from infection Outcome: Progressing   Problem: Elimination: Goal: Will not experience complications related to urinary retention Outcome: Progressing   Problem: Pain Managment: Goal: General experience of comfort will improve Outcome: Progressing   Problem: Safety: Goal: Ability to remain free from injury will improve Outcome: Progressing   Problem: Skin Integrity: Goal: Risk for impaired skin integrity will decrease Outcome: Progressing   Problem: Education: Goal: Knowledge of Childbirth will improve Outcome: Completed/Met Goal: Ability to make informed decisions regarding treatment and plan of care will improve Outcome: Completed/Met Goal: Ability to state and carry out methods to decrease the pain will improve Outcome: Completed/Met   Problem: Coping: Goal: Ability to verbalize concerns and feelings about labor and delivery will improve Outcome: Completed/Met   Problem: Life Cycle: Goal: Ability to make normal progression through stages of labor will improve Outcome: Completed/Met Goal: Ability to effectively push during vaginal delivery will improve Outcome: Completed/Met   Problem: Role Relationship: Goal: Will demonstrate positive interactions with the child Outcome: Completed/Met   Problem: Safety: Goal: Risk of complications during labor and delivery will decrease Outcome: Completed/Met   Problem: Pain Management: Goal: Relief or control of pain from uterine  contractions will improve Outcome: Completed/Met   Problem: Clinical Measurements: Goal: Diagnostic test results will improve Outcome: Completed/Met Goal: Respiratory complications will improve Outcome: Completed/Met Goal: Cardiovascular complication will be avoided Outcome: Completed/Met   Problem: Activity: Goal: Risk for activity intolerance will decrease Outcome: Completed/Met   Problem: Nutrition: Goal: Adequate nutrition will be maintained Outcome: Completed/Met

## 2021-06-19 NOTE — Progress Notes (Signed)
Patient ID: Holly Abbott, female   DOB: 10-Aug-1996, 25 y.o.   MRN: [id number] Pecola Leisure has to stay for no stool so cancelling d/c

## 2021-06-19 NOTE — Discharge Summary (Addendum)
Postpartum Discharge Summary       Patient Name: Holly Abbott DOB: 26-Jul-1996 MRN: [id number]  Date of admission: 06/18/2021 Delivery date:06/18/2021  Delivering provider: Pryor Ochoa [facility]  Date of discharge: 06/20/2021  Admitting diagnosis: Term pregnancy [Z34.90] Intrauterine pregnancy: [redacted]w[redacted]d     Secondary diagnosis:  Active Problems:   Term pregnancy   SVD (spontaneous vaginal delivery)  Additional problems: unexplained syncope antepartum    Discharge diagnosis: Term Pregnancy Delivered                                              Post partum procedures: none Augmentation: AROM, Pitocin, and Cytotec Complications: None  Hospital course: Induction of Labor With Vaginal Delivery   25 y.o. yo G2P2002 at [redacted]w[redacted]d was admitted to the hospital 06/18/2021 for induction of labor.  Indication for induction: Favorable cervix at term.  Patient had an uncomplicated labor course as follows: Membrane Rupture Time/Date: 9:57 AM ,06/18/2021   Delivery Method:Vaginal, Spontaneous  Episiotomy: None  Lacerations:  Vaginal;1st degree  Details of delivery can be found in separate delivery note.  Patient had a routine postpartum course. Patient is discharged home 06/20/21.  Newborn Data: Birth date:06/18/2021  Birth time:11:15 AM  Gender:Female  Living status:Living  Apgars:8 ,9  Weight:3490 g     Physical exam  Vitals:   06/18/21 2230 06/19/21 0547 06/19/21 2245 06/20/21 0508  BP: 100/72 104/69 99/66 102/68  Pulse: 79 70 67 61  Resp: 18 18 18 16   Temp: 98.5 F (36.9 C) 98.6 F (37 C) 98.3 F (36.8 C) 98.2 F (36.8 C)  TempSrc: Oral Oral Oral Oral  SpO2: 100% 99%  96%  Weight:      Height:       General: alert and cooperative Lochia: appropriate Uterine Fundus: firm  Labs: Lab Results  Component Value Date   WBC 11.1 (H) 06/19/2021   HGB 10.5 (L) 06/19/2021   HCT 32.1 (L) 06/19/2021   MCV 93.6 06/19/2021   PLT 218 06/19/2021   CMP Latest Ref Rng & Units  04/26/2018  Glucose 65 - 99 mg/dL 92  BUN 6 - 20 mg/dL 11  Creatinine 06/26/2018 - 7.35 mg/dL 7.89  Sodium 7.84 - 784 mmol/L 138  Potassium 3.5 - 5.1 mmol/L 3.6  Chloride 101 - 111 mmol/L 106  CO2 22 - 32 mmol/L 24  Calcium 8.9 - 10.3 mg/dL 128)  Total Protein 6.5 - 8.1 g/dL 7.6  Total Bilirubin 0.3 - 1.2 mg/dL 0.4  Alkaline Phos 38 - 126 U/L 46  AST 15 - 41 U/L 19  ALT 14 - 54 U/L 16   Edinburgh Score: Edinburgh Postnatal Depression Scale Screening Tool 06/19/2021  I have been able to laugh and see the funny side of things. 0  I have looked forward with enjoyment to things. 0  I have blamed myself unnecessarily when things went wrong. 0  I have been anxious or worried for no good reason. 0  I have felt scared or panicky for no good reason. 0  Things have been getting on top of me. 0  I have been so unhappy that I have had difficulty sleeping. 0  I have felt sad or miserable. 1  I have been so unhappy that I have been crying. 0  The thought of harming myself has occurred to me. 0  06/21/2021 Postnatal  Depression Scale Total 1     After visit meds:  Allergies as of 06/20/2021   No Known Allergies      Medication List     STOP taking these medications    ondansetron 4 MG tablet Commonly known as: ZOFRAN       TAKE these medications    acetaminophen 325 MG tablet Commonly known as: Tylenol Take 2 tablets (650 mg total) by mouth every 4 (four) hours as needed (for pain scale < 4).   albuterol 108 (90 Base) MCG/ACT inhaler Commonly known as: VENTOLIN HFA Inhale 2 puffs into the lungs every 6 (six) hours as needed.   ibuprofen 600 MG tablet Commonly known as: ADVIL Take 1 tablet (600 mg total) by mouth every 6 (six) hours.   Qnasl 80 MCG/ACT Aers Generic drug: Beclomethasone Dipropionate Place 1 puff into the nose daily as needed.         Discharge home in stable condition Infant Feeding: Breast Infant Disposition:home with mother Discharge instruction: per  After Visit Summary and Postpartum booklet. Activity: Advance as tolerated. Pelvic rest for 6 weeks.  Diet: routine diet Future Appointments:No future appointments. Follow up Visit:  Follow-up Information     Edwinna Areola, DO Follow up in 6 week(s).   Specialty: Obstetrics and Gynecology Why: Routine postpartum exam Contact information: [address] [place] [address] [place] [address] [phone]                  Please schedule this patient for a In person postpartum visit in 6 weeks with the following provider: MD.  Delivery mode:  Vaginal, Spontaneous  Anticipated Birth Control:  Unsure   06/20/2021 Zenaida Niece, MD

## 2021-06-20 NOTE — Progress Notes (Signed)
PPD #2 Doing well, baby had small BM Afeb, VSS D/c home

## 2021-06-20 NOTE — Discharge Instructions (Signed)
As per discharge pamphlet °

## 2021-06-29 ENCOUNTER — Telehealth (HOSPITAL_COMMUNITY): Payer: Self-pay

## 2021-06-29 NOTE — Telephone Encounter (Signed)
No answer. Left message to return nurse call.  Marcelino Duster [facility] 06/29/2021,1634

## 2021-07-15 DIAGNOSIS — R519 Headache, unspecified: Secondary | ICD-10-CM | POA: Diagnosis not present

## 2021-07-15 DIAGNOSIS — G43909 Migraine, unspecified, not intractable, without status migrainosus: Secondary | ICD-10-CM | POA: Diagnosis not present

## 2021-08-01 DIAGNOSIS — Z3009 Encounter for other general counseling and advice on contraception: Secondary | ICD-10-CM | POA: Diagnosis not present

## 2021-08-01 DIAGNOSIS — Z1389 Encounter for screening for other disorder: Secondary | ICD-10-CM | POA: Diagnosis not present

## 2021-08-15 ENCOUNTER — Ambulatory Visit (INDEPENDENT_AMBULATORY_CARE_PROVIDER_SITE_OTHER): Payer: 59 | Admitting: Family Medicine

## 2021-08-15 ENCOUNTER — Other Ambulatory Visit: Payer: Self-pay

## 2021-08-15 ENCOUNTER — Encounter: Payer: Self-pay | Admitting: Family Medicine

## 2021-08-15 VITALS — BP 106/72 | HR 61 | Temp 98.2°F | Wt 155.0 lb

## 2021-08-15 DIAGNOSIS — M419 Scoliosis, unspecified: Secondary | ICD-10-CM | POA: Diagnosis not present

## 2021-08-15 DIAGNOSIS — M549 Dorsalgia, unspecified: Secondary | ICD-10-CM

## 2021-08-15 DIAGNOSIS — S29012A Strain of muscle and tendon of back wall of thorax, initial encounter: Secondary | ICD-10-CM | POA: Diagnosis not present

## 2021-08-15 NOTE — Progress Notes (Signed)
Subjective:    Patient ID: Holly Abbott, female    DOB: December 07, 1995, 25 y.o.   MRN: [id number]  Chief Complaint  Patient presents with   Shoulder Pain    Rt shoulder blade, goin on about a week, no heavy lifting. More painful on Monday irritation with any movement, tense burning sensation. At rest no pain.    HPI Patient was seen today for acute concern.  Pt endorses R upper back pain/burning sensation since last wk.  Pt denies increased weakness in UEs, heavy lifting, pushing, or pulling other than carrying her newborn in the car seat.  At times makes it difficult to get comfortable when trying to sleep.  Patient is right-handed.  Currently breast-feeding.  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  Notes recent increased filing for papers.  Working to make ergonomic modifications to her workspace.  Past Medical History:  Diagnosis Date   Anemia    History of pre-eclampsia    Pilonidal cyst    Scoliosis    Pt known as a child; MD @ bedside when notified     No Known Allergies  ROS General: Denies fever, chills, night sweats, changes in weight, changes in appetite HEENT: Denies headaches, ear pain, changes in vision, rhinorrhea, sore throat CV: Denies CP, palpitations, SOB, orthopnea Pulm: Denies SOB, cough, wheezing GI: Denies abdominal pain, nausea, vomiting, diarrhea, constipation GU: Denies dysuria, hematuria, frequency, vaginal discharge Msk: Denies muscle cramps, joint pains + posterior right upper shoulder pain/burning Neuro: Denies weakness, numbness, tingling Skin: Denies rashes, bruising Psych: Denies depression, anxiety, hallucinations    Objective:    Blood pressure 106/72, pulse 61, temperature 98.2 F (36.8 C), temperature source Oral, weight 155 lb (70.3 kg), SpO2 94 %, currently breastfeeding.  Gen. Pleasant, well-nourished, in no distress, normal affect   HEENT: [place]/AT, face symmetric, conjunctiva clear, no scleral icterus,  PERRLA, EOMI, nares patent without drainage Lungs: no accessory muscle use Cardiovascular: RRR, no peripheral edema Musculoskeletal: Shoulders appear uneven upon inspection, L higher than R.  Scoliosis of cervicothoracic spine noted on palpation.  No TTP of cervical, thoracic, lumbar spine.  TTP at origin of R rhomboid major with tightness noted.  Normal ROM of bilateral UEs.  Negative impingement and empty can.  No cyanosis or clubbing, normal tone Neuro:  A&Ox3, CN II-XII intact, normal gait Skin:  Warm, no lesions/ rash   Wt Readings from Last 3 Encounters:  08/15/21 155 lb (70.3 kg)  06/18/21 173 lb (78.5 kg)  09/12/20 145 lb (65.8 kg)    Lab Results  Component Value Date   WBC 11.1 (H) 06/19/2021   HGB 10.5 (L) 06/19/2021   HCT 32.1 (L) 06/19/2021   PLT 218 06/19/2021   GLUCOSE 92 04/26/2018   ALT 16 04/26/2018   AST 19 04/26/2018   NA 138 04/26/2018   K 3.6 04/26/2018   CL 106 04/26/2018   CREATININE 0.91 04/26/2018   BUN 11 04/26/2018   CO2 24 04/26/2018    Assessment/Plan:  Scoliosis of cervicothoracic spine, unspecified scoliosis type -stable -noted as a child -discussed exercises to strengthen core.  Given handout -consider imaging - Plan: Ambulatory referral to Physical Therapy  Strain of rhomboid muscle, initial encounter  -Discussed supportive care including Tylenol, heat, stretching -Given exercises - Plan: Ambulatory referral to Physical Therapy  Upper back pain  -Likely 2/2 muscle strain -Discussed continuing supportive care including heat, rest, stretching, Tylenol, topical analgesics -Given stretching exercises -Will avoid muscle relaxer at this time  as patient is currently breast-feeding. - Plan: Ambulatory referral to Physical Therapy  F/u as needed in the next few weeks  Abbe Amsterdam, MD

## 2021-09-03 ENCOUNTER — Other Ambulatory Visit: Payer: Self-pay

## 2021-09-03 ENCOUNTER — Ambulatory Visit: Payer: 59 | Attending: Family Medicine | Admitting: Rehabilitative and Restorative Service Providers"

## 2021-09-03 ENCOUNTER — Encounter: Payer: Self-pay | Admitting: Rehabilitative and Restorative Service Providers"

## 2021-09-03 DIAGNOSIS — X58XXXA Exposure to other specified factors, initial encounter: Secondary | ICD-10-CM | POA: Diagnosis not present

## 2021-09-03 DIAGNOSIS — M6281 Muscle weakness (generalized): Secondary | ICD-10-CM | POA: Diagnosis not present

## 2021-09-03 DIAGNOSIS — M419 Scoliosis, unspecified: Secondary | ICD-10-CM | POA: Diagnosis not present

## 2021-09-03 DIAGNOSIS — R293 Abnormal posture: Secondary | ICD-10-CM | POA: Diagnosis not present

## 2021-09-03 DIAGNOSIS — M549 Dorsalgia, unspecified: Secondary | ICD-10-CM | POA: Insufficient documentation

## 2021-09-03 DIAGNOSIS — R252 Cramp and spasm: Secondary | ICD-10-CM

## 2021-09-03 DIAGNOSIS — S29012A Strain of muscle and tendon of back wall of thorax, initial encounter: Secondary | ICD-10-CM | POA: Diagnosis not present

## 2021-09-03 NOTE — Therapy (Signed)
[facility] [address] [place], [place], [address] Phone: [phone]   Fax:  [phone]  Physical Therapy Evaluation  Patient Details  Name: Holly Abbott MRN: [id number] Date of Birth: 1996-02-05 Referring Provider (PT): Dr Abbe Amsterdam   Encounter Date: 09/03/2021   PT End of Session - 09/03/21 1436     Visit Number 1    Date for PT Re-Evaluation 10/25/21    Authorization Type [facility]    PT Start Time 1403    PT Stop Time 1441    PT Time Calculation (min) 38 min    Activity Tolerance Patient tolerated treatment well    Behavior During Therapy [facility] for tasks assessed/performed             Past Medical History:  Diagnosis Date   Anemia    History of pre-eclampsia    Pilonidal cyst    Scoliosis    Pt known as a child; MD @ bedside when notified     Past Surgical History:  Procedure Laterality Date   INCISE AND DRAIN ABCESS  2012   pilonidal cyst   removal of cyst     WISDOM TOOTH EXTRACTION      There were no vitals filed for this visit.    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  She states that she had increased pain primarily after returning to after starting her job where she had to do increased computer work Designer, multimedia. Pt reports limited activity tolerance when carrying her baby.  She reports baby is 13 pounds currently.    Pertinent History post partum 06/18/21, scoliosis    Limitations Lifting    How long can you sit comfortably? 30 min    How long can you stand comfortably? 10 min when holding baby    How long can you walk comfortably? 5-10 min when carrying baby    Patient Stated Goals Pt would like to be able to move and carry her daughter without exhausting herself.    Currently in Pain? Yes    Pain Score 6     Pain Location Scapula    Pain Orientation  Right;Upper    Pain Descriptors / Indicators Aching;Numbness    Pain Type Acute pain;Post Delivery (post partum)    Pain Radiating Towards down R arm    Pain Onset More than a month ago    Pain Frequency Intermittent    Aggravating Factors  motion    Pain Relieving Factors rest    Effect of Pain on Daily Activities difficulty carrying daughter                [facility] PT Assessment - 09/03/21 0001       Assessment   Medical Diagnosis M41.9 (ICD-10-CM) - Scoliosis of cervicothoracic spine, unspecified scoliosis type  S29.012A (ICD-10-CM) - Strain of rhomboid muscle, initial encounter  M54.9 (ICD-10-CM) - Upper back pain    Referring Provider (PT) Dr Abbe Amsterdam    Hand Dominance Right    Next MD Visit as needed    Prior Therapy no      Precautions   Precautions None      Restrictions   Weight Bearing Restrictions No      Balance Screen   Has the patient fallen in the past 6 months Yes  How many times? 1   Pt had one syncopal episode in July at end range of pregnancy due to hypotension.   Has the patient had a decrease in activity level because of a fear of falling?  No    Is the patient reluctant to leave their home because of a fear of falling?  No      Home Environment   Living Environment Private residence    Living Arrangements Spouse/significant other    Type of Home Apartment   3rd floor apartment   Home Access Stairs to enter    Home Layout One level      Prior Function   Level of Independence Independent    Vocation Full time employment    Tax adviser.  Increased time at computer    Leisure Driving, playing with children      Cognition   Overall Cognitive Status Within Functional Limits for tasks assessed      Posture/Postural Control   Posture/Postural Control Postural limitations    Postural Limitations Rounded Shoulders;Increased lumbar lordosis    Posture Comments cervicothoracic scoliosis, R shoulder lower than L in standing.       ROM / Strength   AROM / PROM / Strength AROM;Strength      AROM   Overall AROM Comments Cervical ROM limited at least 25% with side bending and rotation.      Strength   Overall Strength Comments R shoulder IR and shoulder extension 4/5      Flexibility   Soft Tissue Assessment /Muscle Length yes   Pec tightness noted     Palpation   Palpation comment TTP R upper trap and rhomboid.                        Objective measurements completed on examination: See above findings.       [facility] Adult PT Treatment/Exercise - 09/03/21 0001       Exercises   Exercises Neck      Neck Exercises: Stretches   Corner Stretch 2 reps;20 seconds      Manual Therapy   Manual Therapy Soft tissue mobilization;Myofascial release;Passive ROM;Scapular mobilization    Manual therapy comments passive ROM cervical, upper trap and levator stretch, scapular depression, cervical mobilization, soft tissue mobilization    Myofascial Release right upper trap manual TPR, left upper manual trap manual TPR                     PT Education - 09/03/21 1434     Education Details Pt provided with HEP    Person(s) Educated Patient    Methods Explanation;Demonstration;Handout    Comprehension Verbalized understanding;Returned demonstration              PT Short Term Goals - 09/03/21 1509       PT SHORT TERM GOAL #1   Title Pt will be independent with initial HEP.    Time 2    Period Weeks    Status New               PT Long Term Goals - 09/03/21 1509       PT LONG TERM GOAL #1   Title Pt to be independent with advanced HEP.    Time 8    Period Weeks    Status New      PT LONG TERM GOAL #2   Title Pt will report at least a 50% improvement in  symptoms to allow her to hold her daughter without increased pain.    Time 8    Period Weeks      PT LONG TERM GOAL #3   Title Pt will have cervical ROM WFL to allow her to drive without difficulty.    Time 8     Period Weeks    Status New      PT LONG TERM GOAL #4   Title Pt will increase R shoulder strength to at least 4+/5 to allow her to lift her 27 year old child.    Time 8    Period Weeks    Status New                    Plan - 09/03/21 1452     Clinical Impression Statement Pt is a 25 y.o. female referred to outpatient PT with a referral from Dr Abbe Amsterdam secondary to scoliosis of cervicothroacic spine and strain of rhomboid muscle. Pt PLOF is indpenedent without pain for daily functional activities. Pt delivered her newborn daughter in July and returned to work in September. Pt has a PMH of cervicothoracic scoliosis from her childhood, but states that her pain has increased since having her daughter and returning to work and having increased time working on the computer. Pt presents with R shoulder weakness, increased scapular/upper thoracic pain, abnormal posture, and muscle spasms with trigger points noted. Pt would benefit from skilled PT to address her functional impairments to allow her to be care for her daughter and perform her work duties without increased pain.    Personal Factors and Comorbidities Comorbidity 1    Comorbidities Cervicothoracic Scoliosis    Examination-Activity Limitations Caring for Others;Carry    Examination-Participation Restrictions Occupation;Interpersonal Relationship    Stability/Clinical Decision Making Stable/Uncomplicated    Clinical Decision Making Low    Rehab Potential Good    PT Frequency 2x / week    PT Duration 8 weeks    PT Treatment/Interventions ADLs/Self Care Home Management;Cryotherapy;Electrical Stimulation;Iontophoresis 4mg /ml Dexamethasone;Moist Heat;Traction;Ultrasound;Functional mobility training;Therapeutic activities;Therapeutic exercise;Patient/family education;Manual techniques;Passive range of motion;Dry needling;Taping;Spinal Manipulations;Joint Manipulations    PT Next Visit Plan assess and progress HEP, core stability,  strengthening    PT Home Exercise Plan Access Code: [email or web address]    Consulted and Agree with Plan of Care Patient             Patient will benefit from skilled therapeutic intervention in order to improve the following deficits and impairments:  Increased muscle spasms, Impaired UE functional use, Decreased activity tolerance, Pain, Impaired flexibility, Decreased range of motion, Decreased strength, Postural dysfunction, Improper body mechanics  Visit Diagnosis: Upper back pain on right side - Plan: PT plan of care cert/re-cert  Muscle weakness (generalized) - Plan: PT plan of care cert/re-cert  Abnormal posture - Plan: PT plan of care cert/re-cert  Cramp and spasm - Plan: PT plan of care cert/re-cert     Problem List Patient Active Problem List   Diagnosis Date Noted   Term pregnancy 06/18/2021   SVD (spontaneous vaginal delivery) 06/18/2021   Respiratory tract infection due to COVID-19 virus 06/27/2020   Postpartum state 06/20/2016   PROM (premature rupture of membranes) 06/19/2016   Pilonidal abscess, I&D 10/10 09/03/2011    11/03/2011, PT, DPT 09/03/2021, 3:15 PM  [facility] Out[facility] @ [facility] [address] [place], [place], [place] Phone: [phone]   Fax:  [phone]  Name: Holly Abbott MRN: Larry Sierras Date of  Birth: 02-28-1996

## 2021-09-03 NOTE — Patient Instructions (Addendum)
Access Code: [email or web address] URL: [email or web address] Date: 09/03/2021 Prepared by: Clydie Braun Andrewjames Weirauch  Exercises Seated Upper Trap Stretch - 2 x daily - 7 x weekly - 1 sets - 10 reps - 5 sec hold Gentle Levator Scapulae Stretch - 2 x daily - 7 x weekly - 1 sets - 10 reps - 5 sec hold Seated Scapular Retraction - 2 x daily - 7 x weekly - 2 sets - 10 reps Seated Shoulder Rolls - 2 x daily - 7 x weekly - 2 sets - 10 reps Doorway Pec Stretch at 90 Degrees Abduction - 1-2 x daily - 7 x weekly - 1 sets - 5 reps - 20 sec hold

## 2021-09-05 ENCOUNTER — Other Ambulatory Visit: Payer: Self-pay

## 2021-09-05 ENCOUNTER — Ambulatory Visit: Payer: 59 | Admitting: Physical Therapy

## 2021-09-05 DIAGNOSIS — R252 Cramp and spasm: Secondary | ICD-10-CM

## 2021-09-05 DIAGNOSIS — R293 Abnormal posture: Secondary | ICD-10-CM

## 2021-09-05 DIAGNOSIS — M6281 Muscle weakness (generalized): Secondary | ICD-10-CM

## 2021-09-05 DIAGNOSIS — S29012A Strain of muscle and tendon of back wall of thorax, initial encounter: Secondary | ICD-10-CM | POA: Diagnosis not present

## 2021-09-05 DIAGNOSIS — M419 Scoliosis, unspecified: Secondary | ICD-10-CM | POA: Diagnosis not present

## 2021-09-05 DIAGNOSIS — M549 Dorsalgia, unspecified: Secondary | ICD-10-CM

## 2021-09-05 NOTE — Patient Instructions (Signed)
Access Code: [email or web address] URL: [email or web address] Date: 09/05/2021 Prepared by: Lavinia Sharps  Exercises Seated Upper Trap Stretch - 2 x daily - 7 x weekly - 1 sets - 10 reps - 5 sec hold Gentle Levator Scapulae Stretch - 2 x daily - 7 x weekly - 1 sets - 10 reps - 5 sec hold Seated Scapular Retraction - 2 x daily - 7 x weekly - 2 sets - 10 reps Seated Shoulder Rolls - 2 x daily - 7 x weekly - 2 sets - 10 reps Doorway Pec Stretch at 90 Degrees Abduction - 1-2 x daily - 7 x weekly - 1 sets - 5 reps - 20 sec hold Seated Shoulder Horizontal Abduction with Resistance - 1 x daily - 7 x weekly - 2 sets - 10 reps Shoulder External Rotation and Scapular Retraction with Resistance - 1 x daily - 7 x weekly - 2 sets - 10 reps Standing Row with Anchored Resistance - 1 x daily - 7 x weekly - 3 sets - 10 reps   Trigger Point Dry Needling  What is Trigger Point Dry Needling (DN)? DN is a physical therapy technique used to treat muscle pain and dysfunction. Specifically, DN helps deactivate muscle trigger points (muscle knots).  A thin filiform needle is used to penetrate the skin and stimulate the underlying trigger point. The goal is for a local twitch response (LTR) to occur and for the trigger point to relax. No medication of any kind is injected during the procedure.   What Does Trigger Point Dry Needling Feel Like?  The procedure feels different for each individual patient. Some patients report that they do not actually feel the needle enter the skin and overall the process is not painful. Very mild bleeding may occur. However, many patients feel a deep cramping in the muscle in which the needle was inserted. This is the local twitch response.   How Will I feel after the treatment? Soreness is normal, and the onset of soreness may not occur for a few hours. Typically this soreness does not last longer than two days.  Bruising is uncommon, however; ice can be used to decrease any  possible bruising.  In rare cases feeling tired or nauseous after the treatment is normal. In addition, your symptoms may get worse before they get better, this period will typically not last longer than 24 hours.   What Can I do After My Treatment? Increase your hydration by drinking more water for the next 24 hours. You may place ice or heat on the areas treated that have become sore, however, do not use heat on inflamed or bruised areas. Heat often brings more relief post needling. You can continue your regular activities, but vigorous activity is not recommended initially after the treatment for 24 hours. DN is best combined with other physical therapy such as strengthening, stretching, and other therapies.

## 2021-09-05 NOTE — Therapy (Signed)
[facility] @ [facility] [address] [place], [place], [address] Phone: [phone]   Fax:  [phone]  Physical Therapy Treatment  Patient Details  Name: Holly Abbott MRN: [id number] Date of Birth: 04-21-1996 Referring Provider (PT): Dr Abbe Amsterdam   Encounter Date: 09/05/2021   PT End of Session - 09/05/21 1653     Visit Number 2    Date for PT Re-Evaluation 10/25/21    Authorization Type [facility]    PT Start Time 1615    PT Stop Time 1649   DN heat   PT Time Calculation (min) 34 min    Activity Tolerance Patient tolerated treatment well             Past Medical History:  Diagnosis Date   Anemia    History of pre-eclampsia    Pilonidal cyst    Scoliosis    Pt known as a child; MD @ bedside when notified     Past Surgical History:  Procedure Laterality Date   INCISE AND DRAIN ABCESS  2012   pilonidal cyst   removal of cyst     WISDOM TOOTH EXTRACTION      There were no vitals filed for this visit.                      [facility] Adult PT Treatment/Exercise - 09/05/21 0001       Neck Exercises: Theraband   Rows 10 reps;Green    Rows Limitations anchored under feet seated    Shoulder External Rotation 10 reps;Green    Shoulder External Rotation Limitations seated    Horizontal ABduction Green;10 reps   palms up   Horizontal ABduction Limitations seated      Neck Exercises: Seated   Other Seated Exercise review of initial stretches per HEP      Moist Heat Therapy   Number Minutes Moist Heat 3 Minutes    Moist Heat Location Cervical      Manual Therapy   Soft tissue mobilization right upper trap and levator scap              Trigger Point Dry Needling - 09/05/21 0001     Consent Given? Yes    Muscles Treated Head and Neck Upper trapezius;Levator scapulae    Other Dry Needling right only    Upper Trapezius Response Twitch reponse elicited;Palpable increased muscle  length    Levator Scapulae Response Palpable increased muscle length                   PT Education - 09/05/21 1652     Education Details DN after care; green band posture ex's for work    [employer]) Educated Patient    Methods Explanation;Demonstration;Handout    Comprehension Returned demonstration;Verbalized understanding              PT Short Term Goals - 09/03/21 1509       PT SHORT TERM GOAL #1   Title Pt will be independent with initial HEP.    Time 2    Period Weeks    Status New               PT Long Term Goals - 09/03/21 1509       PT LONG TERM GOAL #1   Title Pt to be independent with advanced HEP.    Time 8    Period Weeks    Status New  PT LONG TERM GOAL #2   Title Pt will report at least a 50% improvement in symptoms to allow her to hold her daughter without increased pain.    Time 8    Period Weeks      PT LONG TERM GOAL #3   Title Pt will have cervical ROM WFL to allow her to drive without difficulty.    Time 8    Period Weeks    Status New      PT LONG TERM GOAL #4   Title Pt will increase R shoulder strength to at least 4+/5 to allow her to lift her 25 year old child.    Time 8    Period Weeks    Status New                   Plan - 09/05/21 1624     Clinical Impression Statement The patient reports improving right upper trap discomfort since initial visit but with 1x of more intense pain with attempted stretching.  Several tender points and taut bands indentified.  Patient receptive to performing DN in conjunction with manual therapy.  Several muscle twitches produced which is a good prognostic indicator.  Much improved cervical sidebending ROM noted.  Instructed in band resisted ex's to promote blood flow to soft tissues during her work day and reduce tissue ischemia.    Examination-Participation Restrictions Occupation;Interpersonal Relationship    Rehab Potential Good    PT Frequency 2x / week    PT Duration  8 weeks    PT Treatment/Interventions ADLs/Self Care Home Management;Cryotherapy;Electrical Stimulation;Iontophoresis 4mg /ml Dexamethasone;Moist Heat;Traction;Ultrasound;Functional mobility training;Therapeutic activities;Therapeutic exercise;Patient/family education;Manual techniques;Passive range of motion;Dry needling;Taping;Spinal Manipulations;Joint Manipulations    PT Next Visit Plan assess response to DN#1 to right upper traps and levator;  review band ex's as needed; postural strengthening; manual therapy    PT Home Exercise Plan Access Code: [email or web address]             Patient will benefit from skilled therapeutic intervention in order to improve the following deficits and impairments:  Increased muscle spasms, Impaired UE functional use, Decreased activity tolerance, Pain, Impaired flexibility, Decreased range of motion, Decreased strength, Postural dysfunction, Improper body mechanics  Visit Diagnosis: Upper back pain on right side  Muscle weakness (generalized)  Abnormal posture  Cramp and spasm     Problem List Patient Active Problem List   Diagnosis Date Noted   Term pregnancy 06/18/2021   SVD (spontaneous vaginal delivery) 06/18/2021   Respiratory tract infection due to COVID-19 virus 06/27/2020   Postpartum state 06/20/2016   PROM (premature rupture of membranes) 06/19/2016   Pilonidal abscess, I&D 10/10 09/03/2011   11/03/2011, PT 09/05/21 4:58 PM Phone: [phone] Fax: [phone]  [phone], PT 09/05/2021, 4:58 PM  [facility] Re[facility] @ [facility] [address] [place], [place], [place] Phone: [phone]   Fax:  [phone]  Name: Holly Abbott MRN: Larry Sierras Date of Birth: [birth year]

## 2021-09-10 ENCOUNTER — Encounter: Payer: Self-pay | Admitting: Physical Therapy

## 2021-09-12 ENCOUNTER — Encounter: Payer: Self-pay | Admitting: Physical Therapy

## 2021-09-17 ENCOUNTER — Encounter: Payer: Self-pay | Admitting: Rehabilitative and Restorative Service Providers"

## 2021-09-19 ENCOUNTER — Encounter: Payer: Self-pay | Admitting: Rehabilitative and Restorative Service Providers"

## 2021-09-24 ENCOUNTER — Encounter: Payer: Self-pay | Admitting: Rehabilitative and Restorative Service Providers"

## 2021-09-24 ENCOUNTER — Telehealth (INDEPENDENT_AMBULATORY_CARE_PROVIDER_SITE_OTHER): Payer: 59 | Admitting: Family Medicine

## 2021-09-24 DIAGNOSIS — R059 Cough, unspecified: Secondary | ICD-10-CM

## 2021-09-24 NOTE — Patient Instructions (Signed)
   ---------------------------------------------------------------------------------------------------------------------------      WORK SLIP:  Patient Holly Abbott,  1996-08-22, was seen for a medical visit today, 09/24/21 . Please excuse from work for a COVID/flu like illness. If Covid19 testing is positive advise 10 days minimum from the onset of symptoms (09/22/21) PLUS 1 day of no fever and improved symptoms. Will defer to employer for a sooner return to work if patient has 2 negative covid tests 48 hours apart and is feeling better, or if symptoms have resolved, it is greater than 5 days since the positive test and the patient can wear a high-quality, tight fitting mask such as N95 or KN95 at all times for an additional 5 days. Would also suggest COVID19 antigen testing is negative prior to early return from a Covid illness.  Sincerely: E-signature: Dr. Kriste Basque, DO [facility] Ph: [phone]   ------------------------------------------------------------------------------------------------------------------------------   -drink plenty of fluids, herbal tea with honey is good  -can gargle warm salt water twice daily for the sore throat  -cough lozenges   -humidifier at night  -COVID19 testing information: [employer]  Most pharmacies also offer testing and home test kits.   -can use nasal saline a few times per day if you have nasal congestion  -can take 1000 IU ( ) Vit D3 and 100-500 mg of Vit C daily per instructions  -If the Covid test is positive, check out the [facility] website for more information on home care, transmission and treatment for COVID19  It was nice to meet you today, and I really hope you are feeling better soon. I help [place] out with telemedicine visits on Tuesdays and Thursdays and am available for visits on those days. If you have any concerns or questions following this visit please  schedule a follow up visit with your Primary Care doctor or seek care at a local urgent care clinic to avoid delays in care.    Seek in person care or schedule a follow up video visit promptly if your symptoms worsen, new concerns arise or you are not improving with treatment. Call 911 and/or seek emergency care if your symptoms are severe or life threatening.

## 2021-09-24 NOTE — Progress Notes (Signed)
Virtual Visit via Video Note  I connected with Holly Abbott  on 09/24/21 at 11:00 AM EDT by a video enabled telemedicine application and verified that I am speaking with the correct person using two identifiers.  Location patient: home, [place] Location provider:work or home office Persons participating in the virtual visit: patient, provider  I discussed the limitations of evaluation and management by telemedicine and the availability of in person appointments. The patient expressed understanding and agreed to proceed.   HPI:  Acute telemedicine visit for cough: -Onset: 2 days ago -took a covid test yesterday morning and it was negative -Symptoms include: cough, fatigue, sore throat, some chills -Denies: CP, SOB, NVD, inability to eat/drink/get out bed -son was sick last week -Has tried:Benadryl -Pertinent past medical history: see below -Pertinent medication allergies: No Known Allergies -COVID-19 vaccine status: Immunization History  Administered Date(s) Administered   PFIZER(Purple Top)SARS-COV-2 Vaccination 08/17/2020, 09/07/2020  -breastfeeding  ROS: See pertinent positives and negatives per HPI.  Past Medical History:  Diagnosis Date   Anemia    History of pre-eclampsia    Pilonidal cyst    Scoliosis    Pt known as a child; MD @ bedside when notified     Past Surgical History:  Procedure Laterality Date   INCISE AND DRAIN ABCESS  2012   pilonidal cyst   removal of cyst     WISDOM TOOTH EXTRACTION       Current Outpatient Medications:    acetaminophen (TYLENOL) 325 MG tablet, Take 2 tablets (650 mg total) by mouth every 4 (four) hours as needed (for pain scale < 4)., Disp: 30 tablet, Rfl: 1   ibuprofen (ADVIL) 600 MG tablet, Take 1 tablet (600 mg total) by mouth every 6 (six) hours. (Patient taking differently: Take 600 mg by mouth every 6 (six) hours as needed.), Disp: 30 tablet, Rfl: 0  EXAM:  VITALS per patient if applicable:  GENERAL: alert, oriented, appears  well and in no acute distress  HEENT: atraumatic, conjunttiva clear, no obvious abnormalities on inspection of external nose and ears  NECK: normal movements of the head and neck  LUNGS: on inspection no signs of respiratory distress, breathing rate appears normal, no obvious gross SOB, gasping or wheezing  CV: no obvious cyanosis  MS: moves all visible extremities without noticeable abnormality  PSYCH/NEURO: pleasant and cooperative, no obvious depression or anxiety, speech and thought processing grossly intact  ASSESSMENT AND PLAN:  Discussed the following assessment and plan:  Cough, unspecified type  -we discussed possible serious and likely etiologies, options for evaluation and workup, limitations of telemedicine visit vs in person visit, treatment, treatment risks and precautions. Pt is agreeable to treatment via telemedicine at this moment. Suspect VURI, possible covid19 vs other. She is concerned about medications and their impact on breastfeeding milk supply. Opted for covid retesting,  nasal saline, cough lozenges and other safe measures per pt instructions.  Work/School slipped offered: provided in patient instructions   Advised to seek prompt in person care if worsening, new symptoms arise, or if is not improving with treatment. Discussed options for inperson care if PCP office not available.    I discussed the assessment and treatment plan with the patient. The patient was provided an opportunity to ask questions and all were answered. The patient agreed with the plan and demonstrated an understanding of the instructions.     Terressa Koyanagi, DO

## 2021-09-26 ENCOUNTER — Encounter: Payer: Self-pay | Admitting: Physical Therapy

## 2021-10-01 ENCOUNTER — Ambulatory Visit: Payer: BC Managed Care – PPO | Attending: Family Medicine | Admitting: Physical Therapy

## 2021-10-01 ENCOUNTER — Other Ambulatory Visit: Payer: Self-pay

## 2021-10-01 DIAGNOSIS — M6281 Muscle weakness (generalized): Secondary | ICD-10-CM | POA: Insufficient documentation

## 2021-10-01 DIAGNOSIS — M549 Dorsalgia, unspecified: Secondary | ICD-10-CM | POA: Insufficient documentation

## 2021-10-01 DIAGNOSIS — R252 Cramp and spasm: Secondary | ICD-10-CM | POA: Insufficient documentation

## 2021-10-01 DIAGNOSIS — R293 Abnormal posture: Secondary | ICD-10-CM | POA: Diagnosis not present

## 2021-10-01 NOTE — Therapy (Signed)
[facility] @ [facility] [address] [place], [place], [address] Phone: [phone]   Fax:  [phone]  Physical Therapy Treatment  Patient Details  Name: Holly Abbott MRN: [id number] Date of Birth: [birth year] Referring Provider (PT): Dr Abbe Amsterdam   Encounter Date: 10/01/2021   PT End of Session - 10/01/21 1645     Visit Number 3    Date for PT Re-Evaluation 10/25/21    Authorization Type [facility]    PT Start Time 1612    PT Stop Time 1640    PT Time Calculation (min) 28 min    Activity Tolerance Patient tolerated treatment well             Past Medical History:  Diagnosis Date   Anemia    History of pre-eclampsia    Pilonidal cyst    Scoliosis    Pt known as a child; MD @ bedside when notified     Past Surgical History:  Procedure Laterality Date   INCISE AND DRAIN ABCESS  2012   pilonidal cyst   removal of cyst     WISDOM TOOTH EXTRACTION      There were no vitals filed for this visit.   Subjective Assessment - 10/01/21 1612     Subjective I"ve been doing really well.  A little shoulder blade pain but so much better than it was. Able to carry infant and her car seat without difficulty now.    Pertinent History post partum 06/18/21, scoliosis    Patient Stated Goals Pt would like to be able to move and carry her daughter without exhausting herself.    Currently in Pain? Yes    Pain Score 2     Pain Location Scapula    Pain Orientation Right    Pain Type Acute pain                               [facility] Adult PT Treatment/Exercise - 10/01/21 0001       Neck Exercises: Machines for Strengthening   UBE (Upper Arm Bike) 4 min forward and back while discussing status/progress    Lat Pull standing 40# 15x      Neck Exercises: Theraband   Other Theraband Exercises green loop around wrists wall slides and lift offs at the top 10x      Neck Exercises: Stretches   Other Neck  Stretches quadruped thead the needle 5x with foam roll      Manual Therapy   Soft tissue mobilization right periscapular muscles              Trigger Point Dry Needling - 10/01/21 0001     Consent Given? Yes    Muscles Treated Upper Quadrant Infraspinatus;Deltoid;Teres major    Other Dry Needling right only    Infraspinatus Response Palpable increased muscle length    Deltoid Response Palpable increased muscle length    Teres major Response Palpable increased muscle length                     PT Short Term Goals - 10/01/21 1651       PT SHORT TERM GOAL #1   Title Pt will be independent with initial HEP.    Status Achieved               PT Long Term Goals - 10/01/21 1651  PT LONG TERM GOAL #1   Title Pt to be independent with advanced HEP.    Time 8    Period Weeks    Status On-going      PT LONG TERM GOAL #2   Title Pt will report at least a 50% improvement in symptoms to allow her to hold her daughter without increased pain.    Baseline 70% improved    Status Achieved      PT LONG TERM GOAL #3   Title Pt will have cervical ROM WFL to allow her to drive without difficulty.    Time 8    Period Weeks    Status On-going      PT LONG TERM GOAL #4   Title Pt will increase R shoulder strength to at least 4+/5 to allow her to lift her 58 year old child.    Time 8    Period Weeks    Status On-going                   Plan - 10/01/21 1645     Clinical Impression Statement The patient reports signficant reduction in pain which she attributes to DN.  She rates her overall improvement at 70%.  She reports she is able to take hold her infanct and lift the carrier with minimal pain now.  No issues with driving.  She reports mild posterior shoulder/scapular pain after extensive cleaning.  She does have some tender points and taut bands in infraspinatus and posterior deltoid muscles.  Much improved following DN and manual therapy.  She should  meet remaining goals in 2-3 visits.    Comorbidities Cervicothoracic Scoliosis    Examination-Activity Limitations Caring for Others;Carry    Examination-Participation Restrictions Occupation;Interpersonal Relationship    Rehab Potential Good    PT Frequency 2x / week    PT Duration 8 weeks    PT Treatment/Interventions ADLs/Self Care Home Management;Cryotherapy;Electrical Stimulation;Iontophoresis 4mg /ml Dexamethasone;Moist Heat;Traction;Ultrasound;Functional mobility training;Therapeutic activities;Therapeutic exercise;Patient/family education;Manual techniques;Passive range of motion;Dry needling;Taping;Spinal Manipulations;Joint Manipulations    PT Next Visit Plan UE MMT;  assess response to DN#2;  band ex; postural strengthening; middle and lower traps strengthening; recheck cervical ROM; decrease treatment frequency or possible discharge in 2-3 visits    PT Home Exercise Plan Access Code: [email or web address]             Patient will benefit from skilled therapeutic intervention in order to improve the following deficits and impairments:  Increased muscle spasms, Impaired UE functional use, Decreased activity tolerance, Pain, Impaired flexibility, Decreased range of motion, Decreased strength, Postural dysfunction, Improper body mechanics  Visit Diagnosis: Upper back pain on right side  Muscle weakness (generalized)  Abnormal posture  Cramp and spasm     Problem List Patient Active Problem List   Diagnosis Date Noted   Term pregnancy 06/18/2021   SVD (spontaneous vaginal delivery) 06/18/2021   Respiratory tract infection due to COVID-19 virus 06/27/2020   Postpartum state 06/20/2016   PROM (premature rupture of membranes) 06/19/2016   Pilonidal abscess, I&D 10/10 09/03/2011   11/03/2011, PT 10/01/21 4:52 PM Phone: [phone] Fax: [phone]  [phone], PT 10/01/2021, 4:52 PM  [facility] @ [facility] [address] [place], [place], [place] Phone: [phone]   Fax:  [phone]  Name: Holly Abbott MRN: Larry Sierras Date of Birth: 25-May-1996

## 2021-10-03 ENCOUNTER — Ambulatory Visit: Payer: BC Managed Care – PPO | Admitting: Physical Therapy

## 2021-10-03 ENCOUNTER — Other Ambulatory Visit: Payer: Self-pay

## 2021-10-03 DIAGNOSIS — R293 Abnormal posture: Secondary | ICD-10-CM | POA: Diagnosis not present

## 2021-10-03 DIAGNOSIS — M6281 Muscle weakness (generalized): Secondary | ICD-10-CM | POA: Diagnosis not present

## 2021-10-03 DIAGNOSIS — R252 Cramp and spasm: Secondary | ICD-10-CM | POA: Diagnosis not present

## 2021-10-03 DIAGNOSIS — M549 Dorsalgia, unspecified: Secondary | ICD-10-CM

## 2021-10-03 NOTE — Therapy (Signed)
[facility] Outpatient [facility] Re[facility] @ [facility] [address] [place], [place], [address] Phone: [phone]   Fax:  [phone]  Physical Therapy Treatment  Patient Details  Name: Holly Abbott MRN: [id number] Date of Birth: 12-07-1995 Referring Provider (PT): Dr Abbe Amsterdam   Encounter Date: 10/03/2021   PT End of Session - 10/03/21 1607     Visit Number 4    Date for PT Re-Evaluation 10/25/21    Authorization Type [facility]    PT Start Time 1536    PT Stop Time 1606    PT Time Calculation (min) 30 min    Activity Tolerance Patient tolerated treatment well             Past Medical History:  Diagnosis Date   Anemia    History of pre-eclampsia    Pilonidal cyst    Scoliosis    Pt known as a child; MD @ bedside when notified     Past Surgical History:  Procedure Laterality Date   INCISE AND DRAIN ABCESS  2012   pilonidal cyst   removal of cyst     WISDOM TOOTH EXTRACTION      There were no vitals filed for this visit.   Subjective Assessment - 10/03/21 1538     Subjective Pretty good since Tuesday.  I was busy today and lifting boxes that activated it a little.    Pertinent History post partum 06/18/21, scoliosis    How long can you stand comfortably? 10 min when holding baby    How long can you walk comfortably? 5-10 min when carrying baby    Patient Stated Goals Pt would like to be able to move and carry her daughter without exhausting herself.    Currently in Pain? Yes    Pain Score 1     Pain Location Scapula    Pain Type Acute pain                               [facility] Adult PT Treatment/Exercise - 10/03/21 0001       Neck Exercises: Machines for Strengthening   UBE (Upper Arm Bike) 5 min forward and back while discussing status/progress    Lat Pull standing 40# 15x    Other Machines for Strengthening seated power tower row 30# 15x      Neck Exercises: Standing   Wall Push Ups Limitations counter  top push ups 15x    Other Standing Exercises red band horizontal abduction 8x    Other Standing Exercises red band diagonals 8x each way      Neck Exercises: Supine   Other Supine Exercise lying on foam roll vertically: UE elevation, Ts, rocking    Other Supine Exercise thoracic extension foam roll 8x      Neck Exercises: Sidelying   Other Sidelying Exercise open books 10x right/left                       PT Short Term Goals - 10/01/21 1651       PT SHORT TERM GOAL #1   Title Pt will be independent with initial HEP.    Status Achieved               PT Long Term Goals - 10/01/21 1651       PT LONG TERM GOAL #1   Title Pt to be independent with advanced HEP.  Time 8    Period Weeks    Status On-going      PT LONG TERM GOAL #2   Title Pt will report at least a 50% improvement in symptoms to allow her to hold her daughter without increased pain.    Baseline 70% improved    Status Achieved      PT LONG TERM GOAL #3   Title Pt will have cervical ROM WFL to allow her to drive without difficulty.    Time 8    Period Weeks    Status On-going      PT LONG TERM GOAL #4   Title Pt will increase R shoulder strength to at least 4+/5 to allow her to lift her 37 year old child.    Time 8    Period Weeks    Status On-going                   Plan - 10/03/21 1607     Clinical Impression Statement The patient is progressing very well with symptom reduction.  Pain level this week has been very low with pain in scapular area primarily.  Good response to DN to this area last visit.  Progressed periscapular strengthening today as well as thoracic mobility ex without pain.  Discussed reducing visit frequency to 1x/week since she is doing so well.  Therapist providing verbal and tactile cues to activate middle and lower traps and lats.    Examination-Participation Restrictions Occupation;Interpersonal Relationship    Rehab Potential Good    PT Frequency 2x /  week    PT Duration 8 weeks    PT Treatment/Interventions ADLs/Self Care Home Management;Cryotherapy;Electrical Stimulation;Iontophoresis 4mg /ml Dexamethasone;Moist Heat;Traction;Ultrasound;Functional mobility training;Therapeutic activities;Therapeutic exercise;Patient/family education;Manual techniques;Passive range of motion;Dry needling;Taping;Spinal Manipulations;Joint Manipulations    PT Next Visit Plan DN as needed;  recheck cervical ROM; postural strengthening especially middle and lower traps; reduce frequency to 1x/week    PT Home Exercise Plan Access Code: [email or web address]             Patient will benefit from skilled therapeutic intervention in order to improve the following deficits and impairments:  Increased muscle spasms, Impaired UE functional use, Decreased activity tolerance, Pain, Impaired flexibility, Decreased range of motion, Decreased strength, Postural dysfunction, Improper body mechanics  Visit Diagnosis: Upper back pain on right side  Muscle weakness (generalized)  Abnormal posture  Cramp and spasm     Problem List Patient Active Problem List   Diagnosis Date Noted   Term pregnancy 06/18/2021   SVD (spontaneous vaginal delivery) 06/18/2021   Respiratory tract infection due to COVID-19 virus 06/27/2020   Postpartum state 06/20/2016   PROM (premature rupture of membranes) 06/19/2016   Pilonidal abscess, I&D 10/10 09/03/2011   11/03/2011, PT 10/03/21 4:12 PM Phone: [phone] Fax: [phone]  [phone], PT 10/03/2021, 4:12 PM  [facility] Outpatient [facility]ty Re[facility] @ [facility] [address] [place], [place], [place] Phone: [phone]   Fax:  [phone]  Name: Marja Adderley MRN: Larry Sierras Date of Birth: 10/05/1996

## 2021-10-08 ENCOUNTER — Other Ambulatory Visit: Payer: Self-pay

## 2021-10-08 ENCOUNTER — Ambulatory Visit: Payer: BC Managed Care – PPO | Admitting: Physical Therapy

## 2021-10-08 DIAGNOSIS — R252 Cramp and spasm: Secondary | ICD-10-CM

## 2021-10-08 DIAGNOSIS — R293 Abnormal posture: Secondary | ICD-10-CM | POA: Diagnosis not present

## 2021-10-08 DIAGNOSIS — M549 Dorsalgia, unspecified: Secondary | ICD-10-CM | POA: Diagnosis not present

## 2021-10-08 DIAGNOSIS — M6281 Muscle weakness (generalized): Secondary | ICD-10-CM | POA: Diagnosis not present

## 2021-10-08 NOTE — Therapy (Signed)
[facility] @ [facility], [place], [address] Phone: [phone]   Fax:  [phone]  Physical Therapy Treatment/Discharge Summary   Patient Details  Name: Holly Abbott MRN: [id number] Date of Birth: Oct 31, 1996 Referring Provider (PT): Dr Grier Mitts   Encounter Date: 10/08/2021   PT End of Session - 10/08/21 1646     Visit Number 5    Date for PT Re-Evaluation 10/25/21    Authorization Type [facility]    PT Start Time 1611    PT Stop Time 1638   discharge visit   PT Time Calculation (min) 27 min    Activity Tolerance Patient tolerated treatment well             Past Medical History:  Diagnosis Date   Anemia    History of pre-eclampsia    Pilonidal cyst    Scoliosis    Pt known as a child; MD @ bedside when notified     Past Surgical History:  Procedure Laterality Date   INCISE AND DRAIN ABCESS  2012   pilonidal cyst   removal of cyst     WISDOM TOOTH EXTRACTION      There were no vitals filed for this visit.   Subjective Assessment - 10/08/21 1611     Subjective Able to pick up the baby without issues.    Pertinent History post partum 06/18/21, scoliosis    Patient Stated Goals Pt would like to be able to move and carry her daughter without exhausting herself.                [facility] PT Assessment - 10/08/21 0001       AROM   Cervical - Right Side Bend 60    Cervical - Left Side Bend 55    Cervical - Right Rotation 45    Cervical - Left Rotation 45      Strength   Overall Strength Comments strength grossly 5/5                           [facility] Adult PT Treatment/Exercise - 10/08/21 0001       Neck Exercises: Machines for Strengthening   UBE (Upper Arm Bike) 5 min forward and back while discussing status/progress    Lat Pull standing 35# 20x      Neck Exercises: Standing   Wall Push Ups Limitations on mat table shoulder taps 10x each side    Left / Chop  Limitations 8# snatch and press overhead 10x right/left    Other Standing Exercises hip hinge rows 2 5# weights 15x    Other Standing Exercises hip hinge with bil shoulder horizontal abduction 2 3# weights 15x      Neck Exercises: Prone   Rows 15 reps    Rows Weights (lbs) 2#    Rows Limitations head off end of mat, row and press overhead    Other Prone Exercise 2# row to press overhead lying diagonally on mat head off 15x                     PT Education - 10/08/21 [place]     Education Details discussed ex progression for home    Person(s) Educated Patient    Methods Explanation;Demonstration;Handout    Comprehension Returned demonstration;Verbalized understanding              PT Short Term Goals - 10/08/21  [place] #1   Title Pt will be independent with initial HEP.    Status Achieved               PT Long Term Goals - 10/08/21 1634       PT LONG TERM GOAL #1   Title Pt to be independent with advanced HEP.    Status Achieved      PT LONG TERM GOAL #2   Title Pt will report at least a 50% improvement in symptoms to allow her to hold her daughter without increased pain.    Baseline 90%    Status Achieved      PT LONG TERM GOAL #3   Title Pt will have cervical ROM WFL to allow her to drive without difficulty.    Status Achieved      PT LONG TERM GOAL #4   Title Pt will increase R shoulder strength to at least 4+/5 to allow her to lift her 30 year old child.    Status Achieved                   Plan - 10/08/21 1646     Clinical Impression Statement The patient has full, symmetrical and painfree cervical sidebending and rotation ROM.  She has grossly 5/5 strength in cervical, scapular and glenohumeral muscles.  She rates her overall improvement at 80-90%.  She is able to lift her infant without difficulty now.  We have discussed a continued HEP with focus on progressive strengthening.  She has met all rehab goals and  recommend discharge from PT at this time.    Comorbidities Cervicothoracic Scoliosis    Examination-Activity Limitations Caring for Others;Carry    Rehab Potential Good    PT Frequency 2x / week    PT Duration 8 weeks    PT Treatment/Interventions ADLs/Self Care Home Management;Cryotherapy;Electrical Stimulation;Iontophoresis 21m/ml Dexamethasone;Moist Heat;Traction;Ultrasound;Functional mobility training;Therapeutic activities;Therapeutic exercise;Patient/family education;Manual techniques;Passive range of motion;Dry needling;Taping;Spinal Manipulations;Joint Manipulations    PT Next Visit Plan Discharge    PT Home Exercise Plan Access Code: [email or web address]             Patient will benefit from skilled therapeutic intervention in order to improve the following deficits and impairments:  Increased muscle spasms, Impaired UE functional use, Decreased activity tolerance, Pain, Impaired flexibility, Decreased range of motion, Decreased strength, Postural dysfunction, Improper body mechanics  Visit Diagnosis: Upper back pain on right side  Muscle weakness (generalized)  Abnormal posture  Cramp and spasm  PHYSICAL THERAPY DISCHARGE SUMMARY  Visits from Start of Care: 5  Current functional level related to goals / functional outcomes: See clinical impressions above.  All goals met.    Remaining deficits: As above   Education / Equipment: HEP   Patient agrees to discharge. Patient goals were met. Patient is being discharged due to meeting the stated rehab goals.    Problem List Patient Active Problem List   Diagnosis Date Noted   Term pregnancy 06/18/2021   SVD (spontaneous vaginal delivery) 06/18/2021   Respiratory tract infection due to COVID-19 virus 06/27/2020   Postpartum state 06/20/2016   PROM (premature rupture of membranes) 06/19/2016   Pilonidal abscess, I&D 10/10 09/03/2011   SRuben Im PT 10/08/21 4:51 PM Phone: [phone]: [phone] SAlvera Singh PT 10/08/2021, 4:50 PM  [email or web address] [id number]: [phone]  Fax:  [phone]  Name: Holly Abbott MRN: [id number] Date of Birth: 1996/01/29

## 2021-10-10 ENCOUNTER — Encounter: Payer: Self-pay | Admitting: Physical Therapy

## 2021-10-15 ENCOUNTER — Encounter: Payer: Self-pay | Admitting: Rehabilitative and Restorative Service Providers"

## 2021-10-22 ENCOUNTER — Encounter: Payer: Self-pay | Admitting: Physical Therapy

## 2021-10-24 ENCOUNTER — Encounter: Payer: Self-pay | Admitting: Physical Therapy

## 2022-02-26 ENCOUNTER — Encounter: Payer: Self-pay | Admitting: Family Medicine

## 2022-02-26 ENCOUNTER — Telehealth (INDEPENDENT_AMBULATORY_CARE_PROVIDER_SITE_OTHER): Payer: BC Managed Care – PPO | Admitting: Family Medicine

## 2022-02-26 DIAGNOSIS — R051 Acute cough: Secondary | ICD-10-CM

## 2022-02-26 DIAGNOSIS — J018 Other acute sinusitis: Secondary | ICD-10-CM

## 2022-02-26 MED ORDER — FLUTICASONE PROPIONATE 50 MCG/ACT NA SUSP: 1.0000 | Freq: Every day | NASAL | 0 refills | Status: DC

## 2022-02-26 MED ORDER — BENZONATATE 100 MG PO CAPS: 100.0000 mg | ORAL_CAPSULE | Freq: Two times a day (BID) | ORAL | 0 refills | Status: DC | PRN

## 2022-02-26 MED ORDER — AMOXICILLIN 500 MG PO TABS
500.0000 mg | ORAL_TABLET | Freq: Two times a day (BID) | ORAL | 0 refills | Status: AC
Start: 2022-02-26 — End: 2022-03-05

## 2022-02-26 NOTE — Progress Notes (Signed)
Virtual Visit via Video Note ? ?I connected with Holly Abbott on 02/26/22 at 11:30 AM EDT by a video enabled telemedicine application 2/2 COVID-19 pandemic and verified that I am speaking with the correct person using two identifiers. ? Location patient: home ?Location provider:work or home office ?Persons participating in the virtual visit: patient, provider ? ?I discussed the limitations of evaluation and management by telemedicine and the availability of in person appointments. The patient expressed understanding and agreed to proceed. ? ?Chief Complaint  ?Patient presents with  ? Cough  ?  Cough and chest congestion, 2 wks. Eased up about a week ago, but has since picked back up. Tried mucinex last night, subsided the cough for the night, but has yellow mucus coming from nose and coughing it up  ? ? ?HPI: ?Pt with rhinorrhea, sneezing, chest heaviness when laying down, cough, laryngitis, HA that started 2 wks ago.  Symptoms seemed to improve for a wk, then cough returned 2 days ago.  Noticed changes in color of phlegm.  Denies fever, chills, n/v, diarrhea, ear pain/pressure, facial pain/pressure. ?Tried Theraflu, mucinex, and sinus rinse. ? ?Pt stopped breastfeeding 4 months ago. ? ?ROS: See pertinent positives and negatives per HPI. ? ?Past Medical History:  ?Diagnosis Date  ? Anemia   ? History of pre-eclampsia   ? Pilonidal cyst   ? Scoliosis   ? Pt known as a child; MD @ bedside when notified   ? ? ?Past Surgical History:  ?Procedure Laterality Date  ? INCISE AND DRAIN ABCESS  2012  ? pilonidal cyst  ? removal of cyst    ? WISDOM TOOTH EXTRACTION    ? ? ?Family History  ?Problem Relation Age of Onset  ? Arthritis Mother   ? Multiple sclerosis Mother   ? Asthma Father   ? Diabetes Maternal Grandmother   ? Diabetes Maternal Grandfather   ? Asthma Paternal Grandmother   ? Cancer Paternal Grandfather   ? Hypertension Neg Hx   ? ? ? ?Current Outpatient Medications:  ?  ibuprofen (ADVIL) 600 MG tablet, Take 1  tablet (600 mg total) by mouth every 6 (six) hours. (Patient taking differently: Take 600 mg by mouth every 6 (six) hours as needed.), Disp: 30 tablet, Rfl: 0 ?  acetaminophen (TYLENOL) 325 MG tablet, Take 2 tablets (650 mg total) by mouth every 4 (four) hours as needed (for pain scale < 4)., Disp: 30 tablet, Rfl: 1 ? ?EXAM: ? ?VITALS per patient if applicable:  RR between 12-20 bpm ? ?GENERAL: alert, oriented, appears well, voice sounds nasal/congested, in no acute distress ? ?HEENT: atraumatic, conjunctiva clear, no obvious abnormalities on inspection of external nose and ears ? ?NECK: normal movements of the head and neck ? ?LUNGS: on inspection no signs of respiratory distress, breathing rate appears normal, no obvious gross SOB, gasping or wheezing ? ?CV: no obvious cyanosis ? ?MS: moves all visible extremities without noticeable abnormality ? ?PSYCH/NEURO: pleasant and cooperative, no obvious depression or anxiety, speech and thought processing grossly intact ? ?ASSESSMENT AND PLAN: ? ?Discussed the following assessment and plan: ? ?Other acute sinusitis, recurrence not specified  ?-Given return symptoms after initial improvement ABX for sinusitis ?-Continue supportive care with OTC medications including saline nasal rinse, Flonase, steam from shower ?- Plan: amoxicillin (AMOXIL) 500 MG tablet, fluticasone (FLONASE) 50 MCG/ACT nasal spray ? ?Acute cough  ?-Likely 2/2 postnasal drainage, allergies, viral etiology ?-Supportive care with OTC cough/cold medications ?- Plan: benzonatate (TESSALON) 100 MG capsule ? ?F/u prn ?  ?  I discussed the assessment and treatment plan with the patient. The patient was provided an opportunity to ask questions and all were answered. The patient agreed with the plan and demonstrated an understanding of the instructions. ?  ?The patient was advised to call back or seek an in-person evaluation if the symptoms worsen or if the condition fails to improve as anticipated. ? ? ?Deeann Saint, MD  ? ?

## 2022-02-27 ENCOUNTER — Telehealth: Payer: Self-pay | Admitting: Family Medicine

## 2022-02-27 NOTE — Telephone Encounter (Signed)
Patient called because the pharmacy states they do not have any prescriptions in for patient. Prescription that were supposed to be sent were  ? ?benzonatate (TESSALON) 100 MG capsule ? ?fluticasone (FLONASE) 50 MCG/ACT nasal spray ? ?amoxicillin (AMOXIL) 500 MG tablet ? ? ? ? ?Please send to ? ?[id number] [place], [place] - [address] Phone:  [phone]  ?Fax:  [phone]  ?  ? ? ? ? ? ? ?Please advise  ?

## 2022-03-03 NOTE — Telephone Encounter (Signed)
See my chart encounter.

## 2022-08-05 ENCOUNTER — Encounter: Payer: Self-pay | Admitting: Family Medicine

## 2022-08-05 ENCOUNTER — Telehealth (INDEPENDENT_AMBULATORY_CARE_PROVIDER_SITE_OTHER): Payer: BC Managed Care – PPO | Admitting: Family Medicine

## 2022-08-05 VITALS — Ht 64.0 in | Wt 155.0 lb

## 2022-08-05 DIAGNOSIS — R0981 Nasal congestion: Secondary | ICD-10-CM | POA: Diagnosis not present

## 2022-08-05 NOTE — Patient Instructions (Addendum)
  HOME CARE TIPS:  -COVID19 testing information: GoldAgenda.is  Most pharmacies also offer testing and home test kits. If the Covid19 test is positive and you desire antiviral treatment, please contact a [place] pharmacy or schedule a follow up virtual visit through your primary care office or through the [employer].  Other test to treat options: http://www.vasquez-vaughn.biz/?click_source=alert  -allegra once daily for 2 weeks  -can use tylenol or aleve if needed for fevers, aches and pains per instructions  -nasal saline sinus rinses twice daily  -stay hydrated, drink plenty of fluids and eat small healthy meals - avoid dairy  -can take 1000 IU ( ) Vit D3 and 100-500 mg of Vit C daily per instructions  -follow up with your doctor in 2-3 days unless improving and feeling better  -stay home while sick, except to seek medical care. If you have COVID19, you will likely be contagious for 7-10 days. Flu or Influenza is likely contagious for about 7 days. Other respiratory viral infections remain contagious for 5-10+ days depending on the virus and many other factors. Wear a good mask that fits snugly (such as N95 or KN95) if around others to reduce the risk of transmission.  It was nice to meet you today, and I really hope you are feeling better soon. I help [facility] out with telemedicine visits on Tuesdays and Thursdays and am happy to help if you need a follow up virtual visit on those days. Otherwise, if you have any concerns or questions following this visit please schedule a follow up visit with your Primary Care doctor or seek care at a local urgent care clinic to avoid delays in care.    Seek in person care or schedule a follow up video visit promptly if your symptoms worsen, new concerns arise or you are not improving with treatment. Call 911 and/or seek emergency care if your symptoms are severe or life threatening.

## 2022-08-05 NOTE — Progress Notes (Signed)
Virtual Visit via Video Note  I connected with Holly Abbott  on 08/05/22 at 12:20 PM EDT by a video enabled telemedicine application and verified that I am speaking with the correct person using two identifiers.  Location patient: [place] Location provider:work or home office Persons participating in the virtual visit: patient, provider  I discussed the limitations and requested verbal permission for telemedicine visit. The patient expressed understanding and agreed to proceed.  HPI:  Acute telemedicine visit for nasal congestion: -Onset: 2 days ago -Symptoms include:chills, body aches, nasal congestion, sneezing, runny nose, mild headache -covid test negative x1 but she wonders if she tested too early -Denies:fever, CP, SOB, NVD -Pertinent past medical history: see below -Pertinent medication allergies:No Known Allergies -COVID-19 vaccine status:  Immunization History  Administered Date(s) Administered   PFIZER(Purple Top)SARS-COV-2 Vaccination 08/17/2020, 09/07/2020     ROS: See pertinent positives and negatives per HPI.  Past Medical History:  Diagnosis Date   Anemia    History of pre-eclampsia    Pilonidal cyst    Scoliosis    Pt known as a child; MD @ bedside when notified     Past Surgical History:  Procedure Laterality Date   INCISE AND DRAIN ABCESS  2012   pilonidal cyst   removal of cyst     WISDOM TOOTH EXTRACTION       Current Outpatient Medications:    fluticasone (FLONASE) 50 MCG/ACT nasal spray, Place 1 spray into both nostrils daily. (Patient not taking: Reported on 08/05/2022), Disp: 16 g, Rfl: 0   ibuprofen (ADVIL) 600 MG tablet, Take 1 tablet (600 mg total) by mouth every 6 (six) hours. (Patient not taking: Reported on 08/05/2022), Disp: 30 tablet, Rfl: 0  EXAM:  VITALS per patient if applicable:  GENERAL: alert, oriented, appears well and in no acute distress  HEENT: atraumatic, conjunttiva clear, no obvious abnormalities on inspection of external nose  and ears  NECK: normal movements of the head and neck  LUNGS: on inspection no signs of respiratory distress, breathing rate appears normal, no obvious gross SOB, gasping or wheezing  CV: no obvious cyanosis  MS: moves all visible extremities without noticeable abnormality  PSYCH/NEURO: pleasant and cooperative, no obvious depression or anxiety, speech and thought processing grossly intact  ASSESSMENT AND PLAN:  Discussed the following assessment and plan:  Nasal congestion  -we discussed possible serious and likely etiologies, options for evaluation and workup, limitations of telemedicine visit vs in person visit, treatment, treatment risks and precautions. Pt is agreeable to treatment via telemedicine at this moment. Query covid, VURI, allergic rhinitis vs other. She opted to retest per cdc recommendations for covid. Nasal saline rinse bid, allegra or zyrtec and other symptomatic care per patient instructions.  Advise to seek prompt virtual visit or in person care if worsening, new symptoms arise, or if is not improving with treatment as expected per our conversation of expected course. Discussed options for follow up care. Did let this patient know that I do telemedicine on Tuesdays and Thursdays for [facility] and those are the days I am logged into the system. Advised to schedule follow up visit with PCP, [place] virtual visits or [facility] if any further questions or concerns to avoid delays in care.   I discussed the assessment and treatment plan with the patient. The patient was provided an opportunity to ask questions and all were answered. The patient agreed with the plan and demonstrated an understanding of the instructions.     Terressa Koyanagi, DO

## 2022-09-24 ENCOUNTER — Ambulatory Visit (INDEPENDENT_AMBULATORY_CARE_PROVIDER_SITE_OTHER): Payer: BC Managed Care – PPO | Admitting: Family Medicine

## 2022-09-24 VITALS — BP 98/82 | HR 93 | Temp 98.9°F | Wt 157.6 lb

## 2022-09-24 DIAGNOSIS — G43829 Menstrual migraine, not intractable, without status migrainosus: Secondary | ICD-10-CM

## 2022-09-24 MED ORDER — RIZATRIPTAN BENZOATE 5 MG PO TABS
5.0000 mg | ORAL_TABLET | ORAL | 0 refills | Status: DC | PRN
Start: 2022-09-24 — End: 2022-09-24

## 2022-09-24 MED ORDER — RIZATRIPTAN BENZOATE 5 MG PO TABS: 5.0000 mg | ORAL_TABLET | ORAL | 2 refills | Status: DC | PRN

## 2022-09-24 NOTE — Patient Instructions (Addendum)
A referral was placed for the CT scan of your head and Neurology referral.  The prescription for Maxalt was sent to the [facility] pharmacy on [address].  I also placed orders for labs to make sure that no electrolyte abnormality could be contributing to your headaches.  GENERAL HEADACHE INSTRUCTIONS Headache Preventive Treatment: Please keep in mind that it takes 4-6 weeks for the medication to start working well and 2-3 months at the appropriate dose before deciding if it will be useful or not. If it is not helping at all by this time, then we will discuss other medications to try. Supplements may take 3-6 months until you see full effect.    Natural supplements: Magnesium Oxide or Magnesium Glycinate 500 mg at bed (up to 800 mg daily) Coenzyme Q10 300 mg in AM Vitamin B2- 200 mg twice a day   Add 1 supplement at a time since even natural supplements can have undesirable side effects. You can sometimes buy supplements cheaper (especially Coenzyme Q10) at www.https://compton-perez.com/ or at [employer].   Vitamins and herbs that show potential:   Magnesium: Magnesium (250 mg twice a day or 500 mg at bed) has a relaxant effect on smooth muscles such as blood vessels. Individuals suffering from frequent or daily headache usually have low magnesium levels which can be increase with daily supplementation of 400-750 mg. Three trials found 40-90% average headache reduction  when used as a preventative. Magnesium also demonstrated the benefit in menstrually related migraine.  Magnesium is part of the messenger system in the serotonin cascade and it is a good muscle relaxant.  It is also useful for constipation which can be a side effect of other medications used to treat migraine. Good sources include nuts, whole grains, and tomatoes. Side Effects: loose stool/diarrhea Riboflavin (vitamin B 2) 200 mg twice a day. This vitamin assists nerve cells in the production of ATP a principal energy storing molecule.  It is necessary for  many chemical reactions in the body.  There have been at least 3 clinical trials of riboflavin using 400 mg per day all of which suggested that migraine frequency can be decreased.  All 3 trials showed significant improvement in over half of migraine sufferers.  The supplement is found in bread, cereal, milk, meat, and poultry.  Most Americans get more riboflavin than the recommended daily allowance, however riboflavin deficiency is not necessary for the supplements to help prevent headache. Side effects: energizing, green urine   Coenzyme Q10: This is present in almost all cells in the body and is critical component for the conversion of energy.  Recent studies have shown that a nutritional supplement of CoQ10 can reduce the frequency of migraine attacks by improving the energy production of cells as with riboflavin.  Doses of 150 mg twice a day have been shown to be effective.   Melatonin: Increasing evidence shows correlation between melatonin secretion and headache conditions.  Melatonin supplementation has decreased headache intensity and duration.  It is widely used as a sleep aid.  Sleep is natures way of dealing with migraine.  A dose of 3 mg is recommended to start for headaches including cluster headache. Higher doses up to 15 mg has been reviewed for use in Cluster headache and have been used. The rationale behind using melatonin for cluster is that many theories regarding the cause of Cluster headache center around the disruption of the normal circadian rhythm in the brain.  This helps restore the normal circadian rhythm.  HEADACHE DIET: Foods and beverages which may trigger migraine Note that only 20% of headache patients are food sensitive. You will know if you are food sensitive if you get a headache consistently 20 minutes to 2 hours after eating a certain food. Only cut out a food if it causes headaches, otherwise you might remove foods you enjoy! What matters most for diet is to eat a  well balanced healthy diet full of vegetables and low fat protein, and to not miss meals.   Chocolate, other sweets ALL cheeses except cottage and cream cheese Dairy products, yogurt, sour cream, ice cream Liver Meat extracts (Bovril, Marmite, meat tenderizers) Meats or fish which have undergone aging, fermenting, pickling or smoking. These include: Hotdogs,salami,Lox,sausage, mortadellas,smoked salmon, pepperoni, Pickled herring Pods of broad bean (English beans, Chinese pea pods, [place] (fava) beans, lima and navy beans Ripe avocado, ripe banana Yeast extracts or active yeast preparations such as Brewer's or Fleishman's (commercial bakes goods are permitted) Tomato based foods, pizza (lasagna, etc.)   MSG (monosodium glutamate) is disguised as many things; look for these common aliases: Monopotassium glutamate Autolysed yeast Hydrolysed protein Sodium caseinate "flavorings" "all natural preservatives" Nutrasweet   Avoid all other foods that convincingly provoke headaches.   Resources: The Dizzy [place] Laundry Your Headache Diet, migrainestrong.com  https://zamora-andrews.com/   Caffeine and Migraine For patients that have migraine, caffeine intake more than 3 days per week can lead to dependency and increased migraine frequency. I would recommend cutting back on your caffeine intake as best you can. The recommended amount of caffeine is 200-300 mg daily, although migraine patients may experience dependency at even lower doses. While you may notice an increase in headache temporarily, cutting back will be helpful for headaches in the long run. For more information on caffeine and migraine, visit: https://americanmigrainefoundation.org/resource-library/caffeine-and-migraine/   Headache Prevention Strategies:   1. Maintain a headache diary; learn to identify and avoid triggers.  - This can be a simple note where you log when you had a  headache, associated symptoms, and medications used - There are several smartphone apps developed to help track migraines: Migraine Buddy, Migraine Monitor, Curelator N1-Headache App   Common triggers include: Emotional triggers: Emotional/Upset family or friends Emotional/Upset occupation Business reversal/success Anticipation anxiety Crisis-serious Post-crisis periodNew job/position   Physical triggers: Vacation Day Weekend Strenuous Exercise High Altitude Location New Move Menstrual Day Physical Illness Oversleep/Not enough sleep Weather changes Light: Photophobia or light sesnitivity treatment involves a balance between desensitization and reduction in overly strong input. Use dark polarized glasses outside, but not inside. Avoid bright or fluorescent light, but do not dim environment to the point that going into a normally lit room hurts. Consider FL-41 tint lenses, which reduce the most irritating wavelengths without blocking too much light.  These can be obtained at axonoptics.com or theraspecs.com Foods: see list above.   2. Limit use of acute treatments (over-the-counter medications, triptans, etc.) to no more than 2 days per week or 10 days per month to prevent medication overuse headache (rebound headache).     3. Follow a regular schedule (including weekends and holidays): Don't skip meals. Eat a balanced diet. 8 hours of sleep nightly. Minimize stress. Exercise 30 minutes per day. Being overweight is associated with a 5 times increased risk of chronic migraine. Keep well hydrated and drink 6-8 glasses of water per day.   4. Initiate non-pharmacologic measures at the earliest onset of your headache. Rest and quiet environment. Relax and reduce stress. Breathe2Relax is a free app  that can instruct you on    some simple relaxtion and breathing techniques. Http://Dawnbuse.com is a    free website that provides teaching videos on relaxation.  Also, there are  many apps  that   can be downloaded for "mindful" relaxation.  An app called YOGA NIDRA will help walk you through mindfulness. Another app called Calm can be downloaded to give you a structured mindfulness guide with daily reminders and skill development. Headspace for guided meditation Mindfulness Based Stress Reduction Online Course: www.palousemindfulness.com Cold compresses.   5. Don't wait!! Take the maximum allowable dosage of prescribed medication at the first sign of migraine.   6. Compliance:  Take prescribed medication regularly as directed and at the first sign of a migraine.   7. Communicate:  Call your physician when problems arise, especially if your headaches change, increase in frequency/severity, or become associated with neurological symptoms (weakness, numbness, slurred speech, etc.).   8. Headache/pain management therapies: Consider various complementary methods, including medication, behavioral therapy, psychological counselling, biofeedback, massage therapy, acupuncture, dry needling, and other modalities.  Such measures may reduce the need for medications. Counseling for pain management, where patients learn to function and ignore/minimize their pain, seems to work very well.   9. Recommend changing family's attention and focus away from patient's headaches. Instead, emphasize daily activities. If first question of day is 'How are your headaches/Do you have a headache today?', then patient will constantly think about headaches, thus making them worse. Goal is to re-direct attention away from headaches, toward daily activities and other distractions.   10. Helpful Websites: www.AmericanHeadacheSociety.org PatentHood.ch www.headaches.org TightMarket.nl www.achenet.org   11. HEADACHE EXPECTATIONS: There are many types of headaches, and only a rare few in which complete relief can be expected.  In general, there is no cure for headache, especially migraine based headaches.   There is nothing available that completely prevents headaches from occurring, breaking through, or having periodic flare-ups and fluctuations.  Regardless of what you are using on a daily basis for prevention, episodic headaches should still be expected, and periods where frequency may escalate and fluctuate are unavoidable.   There is no quick fix for most headaches.  Furthermore, the longer you have had high frequency headaches (such as chronic daily headache), the longer it will likely take to expect any improvement.  In fact, some people will never improve, regardless of how many medications or other treatments we try.  Our treatment strategy is to evaluate for possible causes of your headache, although testing is usually always normal, even in cases of daily continuous headaches for years.  Most types of headache such as migraine are electrical brain disorders (similar to how epilepsy is an electrical brain disorders).  Therefore, there is no testing that will reveal this "dysfunctional electrical circuitry" such on MRI, or other testing.  We try to find a medication that may help lessen the frequency and/or severity of your headaches.  The goal is not to completely stop them from happening, although if that happens, great!  Different people respond to different medications, and some people just don't respond to anything, so it's usually a matter of trying different options.  We cannot predict if or when exactly you will respond to a treatment that we provide.   Preventive headache medications take 4-6 weeks to start working, and 2-3 months to see full effect, assuming you reach an effective dose.  Therefore, calling or messaging frequently because you have a headache flare prior to the 3 month mark  is unlikely to change anything, and unfortunately there is nothing available that will expedite this, so please try to avoid this.  Our recommendation will generally be to give it adequate time first.  If you  are unable to wait it out for medications to work, we can also try IV infusions for some temporary relief.     In general, the best that preventive medications or other treatments (including Botox) are able to offer in migraine management (variable in other headache types) is a 50% improvement in frequency and/or severity of headache.  That is our goal, and any additional benefit is considered a bonus.  Some people do significantly better than this, others do not get close to this.  Therefore, if your headaches are not improving by at least 3 months on your preventive strategy, contact us and we can discuss further adjustments.  Keep in mind that complete headache cure is not a realistic expectation.

## 2022-09-24 NOTE — Progress Notes (Signed)
Subjective:    Patient ID: Holly Abbott, female    DOB: 10/08/1996, 26 y.o.   MRN: [id number]  Chief Complaint  Patient presents with   Headache    Headaches causing nasuea, sometimes more like migraines. Pain starts from right front side and goes down and to rt shoulder. Noticed them last July after giving birth, will have migraine before cycle and whole cycle will have headache. Has tried Excedrin migraine, 2 days ago, noticed today that just brushing hair causes pain throughout whole head.    HPI Patient was seen today for ongoing concern.  Pt endorses migraine HAs x 1 yr s/p birth of her daughter.  Pt with pain in R temple, along temporal area of head, and into R neck and shoulder.  Typically occur during ovulation and 2 days prior to menses.  Tried excedrin migraine on Monday.  Sleep is good, not drinking much caffeine.  On Sunday got "hot" at church.  Took Excedrin Migraine on Monday after waking up with HA.  Had some nausea and lightheaded sensation with getting up too fast.  Denies changes in vision.  Had eye exam few months ago.  Denies increased stress though moved to [place].  Past Medical History:  Diagnosis Date   Anemia    History of pre-eclampsia    Pilonidal cyst    Scoliosis    Pt known as a child; MD @ bedside when notified     No Known Allergies  ROS General: Denies fever, chills, night sweats, changes in weight, changes in appetite HEENT: Denies ear pain, changes in vision, rhinorrhea, sore throat +HAs CV: Denies CP, palpitations, SOB, orthopnea Pulm: Denies SOB, cough, wheezing GI: Denies abdominal pain, nausea, vomiting, diarrhea, constipation  +Nausea GU: Denies dysuria, hematuria, frequency, vaginal discharge Msk: Denies muscle cramps, joint pains Neuro: Denies weakness, numbness, tingling Skin: Denies rashes, bruising Psych: Denies depression, anxiety, hallucinations     Objective:    Blood pressure 98/82, pulse 93, temperature 98.9 F (37.2 C),  temperature source Oral, weight 157 lb 9.6 oz (71.5 kg), SpO2 97 %, not currently breastfeeding.   Gen. Pleasant, well-nourished, in no distress, normal affect   HEENT: [place]/AT, face symmetric, conjunctiva clear, no scleral icterus, PERRLA, EOMI, no nystagmus, nares patent without drainage.  TMs normal b/l. Lungs: no accessory muscle use, CTAB, no wheezes or rales Cardiovascular: RRR, no m/r/g, no peripheral edema Musculoskeletal: No deformities, no cyanosis or clubbing, normal tone Neuro:  A&Ox3, CN II-XII intact, normal gait Skin:  Warm, no lesions/ rash   Wt Readings from Last 3 Encounters:  09/24/22 157 lb 9.6 oz (71.5 kg)  08/05/22 155 lb (70.3 kg)  08/15/21 155 lb (70.3 kg)    Lab Results  Component Value Date   WBC 11.1 (H) 06/19/2021   HGB 10.5 (L) 06/19/2021   HCT 32.1 (L) 06/19/2021   PLT 218 06/19/2021   GLUCOSE 92 04/26/2018   ALT 16 04/26/2018   AST 19 04/26/2018   NA 138 04/26/2018   K 3.6 04/26/2018   CL 106 04/26/2018   CREATININE 0.91 04/26/2018   BUN 11 04/26/2018   CO2 24 04/26/2018    Assessment/Plan:  Menstrual migraine without status migrainosus, not intractable -likely 2/2 premenstrual hormonal changes.   -labs to r/o reversible causes. -given duration of symptoms obtain CT. -trial of maxalt prn -consider referral to Neurology in [place]: Ambulatory referral to Neurology, CT HEAD WO CONTRAST (5MM), CMP, CBC with Differential/Platelet, TSH, T4, Free, Magnesium, rizatriptan (MAXALT)  5 MG tablet  F/u prn  Abbe Amsterdam, MD

## 2022-09-25 LAB — COMPREHENSIVE METABOLIC PANEL
ALT: 9 U/L (ref 0–35)
AST: 17 U/L (ref 0–37)
Albumin: 4.3 g/dL (ref 3.5–5.2)
Alkaline Phosphatase: 52 U/L (ref 39–117)
BUN: 12 mg/dL (ref 6–23)
CO2: 26 mEq/L (ref 19–32)
Calcium: 9.4 mg/dL (ref 8.4–10.5)
Chloride: 102 mEq/L (ref 96–112)
Creatinine, Ser: 0.84 mg/dL (ref 0.40–1.20)
GFR: 94.64 mL/min (ref >60.00)
Glucose, Bld: 83 mg/dL (ref 70–99)
Potassium: 3.8 mEq/L (ref 3.5–5.1)
Sodium: 136 mEq/L (ref 135–145)
Total Bilirubin: 0.4 mg/dL (ref 0.2–1.2)
Total Protein: 7.8 g/dL (ref 6.0–8.3)

## 2022-09-25 LAB — CBC WITH DIFFERENTIAL/PLATELET
Basophils Absolute: 0.1 10*3/uL (ref 0.0–0.1)
Basophils Relative: 0.8 % (ref 0.0–3.0)
Eosinophils Absolute: 0.3 10*3/uL (ref 0.0–0.7)
Eosinophils Relative: 3.7 % (ref 0.0–5.0)
HCT: 39.2 % (ref 36.0–46.0)
Hemoglobin: 12.9 g/dL (ref 12.0–15.0)
Lymphocytes Relative: 33.8 % (ref 12.0–46.0)
Lymphs Abs: 2.6 10*3/uL (ref 0.7–4.0)
MCHC: 32.9 g/dL (ref 30.0–36.0)
MCV: 95.6 fl (ref 78.0–100.0)
Monocytes Absolute: 0.7 10*3/uL (ref 0.1–1.0)
Monocytes Relative: 8.7 % (ref 3.0–12.0)
Neutro Abs: 4.1 10*3/uL (ref 1.4–7.7)
Neutrophils Relative %: 53 % (ref 43.0–77.0)
Platelets: 298 10*3/uL (ref 150.0–400.0)
RBC: 4.1 Mil/uL (ref 3.87–5.11)
RDW: 12.8 % (ref 11.5–15.5)
WBC: 7.7 10*3/uL (ref 4.0–10.5)

## 2022-09-25 LAB — T4, FREE: Free T4: 0.81 ng/dL (ref 0.60–1.60)

## 2022-09-25 LAB — MAGNESIUM: Magnesium: 1.8 mg/dL (ref 1.5–2.5)

## 2022-09-25 LAB — TSH: TSH: 1.46 u[IU]/mL (ref 0.35–5.50)

## 2022-09-26 ENCOUNTER — Encounter: Payer: Self-pay | Admitting: Neurology

## 2022-10-01 ENCOUNTER — Ambulatory Visit
Admission: RE | Admit: 2022-10-01 | Discharge: 2022-10-01 | Disposition: A | Discharge status: Home / Self Care | Payer: BC Managed Care – PPO | Source: Ambulatory Visit | Attending: Family Medicine | Admitting: Family Medicine

## 2022-10-01 DIAGNOSIS — G43829 Menstrual migraine, not intractable, without status migrainosus: Secondary | ICD-10-CM

## 2022-10-05 ENCOUNTER — Encounter: Payer: Self-pay | Admitting: Family Medicine

## 2022-10-07 ENCOUNTER — Encounter: Payer: Self-pay | Admitting: Family Medicine

## 2022-11-04 NOTE — Progress Notes (Unsigned)
NEUROLOGY CONSULTATION NOTE  [name] MRN: [id number] DOB: 16-Mar-1996  Referring provider: Abbe Amsterdam, MD Primary care provider: Abbe Amsterdam, MD  Reason for consult:  migraines  Assessment/Plan:   Menstrually related migraines, with status migrainosus, not intractable  Migraine prevention:  start nortriptyline 10mg  at bedtime. We can increase to 25mg  at bedtime in 4 weeks as needed Migraine rescue:  continue rizatriptan 5mg .  Zofran ODT 4mg  for nausea Limit use of pain relievers to no more than 2 days out of week to prevent risk of rebound or medication-overuse headache. Keep headache diary Caffeine cessation, hydration, exercise, sleep hygiene Follow up 4 to 5 months.    Subjective:  Holly Abbott is a 26 year old right-handed female who presents for migraines.  History supplemented by referring provider's note.  Onset:  Occasionally up until her late 42s. Became more frequent after having her daughter in July 2022 Location:  usually right frontal (sometimes left-sided),sometimes may radiate down the right side of her neck, sometimes dull pain in middle of head Quality:  throbbing Intensity:  mild to severe.   Aura:  absent Prodrome:  absent Associated symptoms:  Nausea, photophobia, sees floaters.  She denies associated vomiting, phonophobia, unilateral numbness or weakness. Duration:  30 minutes with rizatriptan, otherwise all day, however around her cycle it can last about a week Frequency:  daily (severe headaches occur 5 to 7 days a month) Frequency of abortive medication: Excedrin or rizatriptan 1 to 2 days a week Triggers:  stress, 1-2 weeks before per period and lasts until 3 days before her period Relieving factors:  rest, keeping still, dark room Activity:  aggravates with severe  CT head on 10/01/2022 personally reviewed revealed partially empty sella but no acute intracranial abnormality.  Past NSAIDS/analgesics:  ibuprofen, Fioricet Past  abortive triptans:  none Past abortive ergotamine:  none Past muscle relaxants:  none Past anti-emetic:  Zofran 4mg  Past antihypertensive medications:  none Past antidepressant medications:  none Past anticonvulsant medications:  none Past anti-CGRP:  none Past vitamins/Herbal/Supplements:  none Past antihistamines/decongestants:  Flonase Other past therapies:  none  Current NSAIDS/analgesics:  Excedrin Migraine Current triptans:  rizatriptan 5mg  Current ergotamine:  none Current anti-emetic:  none Current muscle relaxants:  none Current Antihypertensive medications:  none Current Antidepressant medications:  none Current Anticonvulsant medications:  none Current anti-CGRP:  none Current Vitamins/Herbal/Supplements:  none Current Antihistamines/Decongestants:  none Other therapy:  none Birth control:  none   Caffeine:  1 tall or grande size cup of coffee but not daily; sometimes Mt Dew Alcohol:  wine occasionally Smoker:  no Diet:  16 oz water daily.  Skips meals Exercise:  no Depression:  no; Anxiety:  yes.  Mother, in grad school to get her MBA, and works as 26 (works remote, goes into office once a week) Other pain:  no Sleep hygiene:  trouble sleeping - stressed.  Started grad school in August (just finished semester) Family history of headache:  mom (she also has seizure disorder and multiple sclerosis)      PAST MEDICAL HISTORY: Past Medical History:  Diagnosis Date   Anemia    History of pre-eclampsia    Pilonidal cyst    Scoliosis    Pt known as a child; MD @ bedside when notified     PAST SURGICAL HISTORY: Past Surgical History:  Procedure Laterality Date   INCISE AND DRAIN ABCESS  2012   pilonidal cyst   removal of cyst     WISDOM  TOOTH EXTRACTION      MEDICATIONS: Current Outpatient Medications on File Prior to Visit  Medication Sig Dispense Refill   rizatriptan (MAXALT) 5 MG tablet Take 1 tablet (5 mg total) by mouth as  needed for migraine. May repeat in 2 hours if needed 10 tablet 2   No current facility-administered medications on file prior to visit.    ALLERGIES: No Known Allergies  FAMILY HISTORY: Family History  Problem Relation Age of Onset   Arthritis Mother    Multiple sclerosis Mother    Asthma Father    Diabetes Maternal Grandmother    Diabetes Maternal Grandfather    Asthma Paternal Grandmother    Cancer Paternal Grandfather    Hypertension Neg Hx     Objective:  Blood pressure 112/75, pulse 86, height 5\' 4"  (1.626 m), weight 159 lb 3.2 oz (72.2 kg), SpO2 98 %, not currently breastfeeding. General: No acute distress.  Patient appears well-groomed.   Head:  Normocephalic/atraumatic Eyes:  fundi examined but not visualized Neck: supple, no paraspinal tenderness, full range of motion Back: No paraspinal tenderness Heart: regular rate and rhythm Lungs: Clear to auscultation bilaterally. Vascular: No carotid bruits. Neurological Exam: Mental status: alert and oriented to person, place, and time, speech fluent and not dysarthric, language intact. Cranial nerves: CN I: not tested CN II: pupils equal, round and reactive to light, visual fields intact CN III, IV, VI:  full range of motion, no nystagmus, no ptosis CN V: facial sensation intact. CN VII: upper and lower face symmetric CN VIII: hearing intact CN IX, X: gag intact, uvula midline CN XI: sternocleidomastoid and trapezius muscles intact CN XII: tongue midline Bulk & Tone: normal, no fasciculations. Motor:  muscle strength 5/5 throughout Sensation:  Pinprick, temperature and vibratory sensation intact. Deep Tendon Reflexes:  2+ throughout,  toes downgoing.   Finger to nose testing:  Without dysmetria.   Heel to shin:  Without dysmetria.   Gait:  Normal station and stride.  Romberg negative.    Thank you for allowing me to take part in the care of this patient.  , DO  CC: Shon Millet, MD

## 2022-11-05 ENCOUNTER — Encounter: Payer: Self-pay | Admitting: Neurology

## 2022-11-05 ENCOUNTER — Ambulatory Visit: Payer: BC Managed Care – PPO | Admitting: Neurology

## 2022-11-05 VITALS — BP 112/75 | HR 86 | Ht 64.0 in | Wt 159.2 lb

## 2022-11-05 DIAGNOSIS — G43821 Menstrual migraine, not intractable, with status migrainosus: Secondary | ICD-10-CM

## 2022-11-05 MED ORDER — NORTRIPTYLINE HCL 10 MG PO CAPS: 10.0000 mg | ORAL_CAPSULE | Freq: Every day | ORAL | 5 refills | Status: DC

## 2022-11-05 MED ORDER — ONDANSETRON 4 MG PO TBDP: 4.0000 mg | ORAL_TABLET | Freq: Three times a day (TID) | ORAL | 5 refills | Status: DC | PRN

## 2022-11-05 NOTE — Patient Instructions (Signed)
  Start nortriptyline 10mg  at bedtime.  Contact in 4 weeks with update and we can increase dose if needed. Take rizatriptan at earliest onset of headache.  May repeat dose once in 2 hours if needed.  Maximum 2 tablets in 24 hours. Use ondansetron for nausea Limit use of pain relievers to no more than 2 days out of the week.  These medications include acetaminophen, NSAIDs (ibuprofen/Advil/Motrin, naproxen/Aleve, triptans (Imitrex/sumatriptan), Excedrin, and narcotics.  This will help reduce risk of rebound headaches. Be aware of common food triggers:  - Caffeine:  coffee, black tea, cola, Mt. Dew  - Chocolate  - Dairy:  aged cheeses (brie, blue, cheddar, gouda, [place], provolone, [place], Swiss, etc), chocolate milk, buttermilk, sour cream, limit eggs and yogurt  - Nuts, peanut butter  - Alcohol  - Cereals/grains:  FRESH breads (fresh bagels, sourdough, doughnuts), yeast productions  - Processed/canned/aged/cured meats (pre-packaged deli meats, hotdogs)  - MSG/glutamate:  soy sauce, flavor enhancer, pickled/preserved/marinated foods  - Sweeteners:  aspartame (Equal, Nutrasweet).  Sugar and Splenda are okay  - Vegetables:  legumes (lima beans, lentils, snow peas, fava beans, pinto peans, peas, garbanzo beans), sauerkraut, onions, olives, pickles  - Fruit:  avocados, bananas, citrus fruit (orange, lemon, grapefruit), mango  - Other:  Frozen meals, macaroni and cheese Routine exercise Stay adequately hydrated (aim for 64 oz water daily) Keep headache diary Maintain proper stress management Maintain proper sleep hygiene Do not skip meals Consider supplements:  magnesium citrate 400mg  daily, riboflavin 400mg  daily, coenzyme Q10 100mg  three times daily. 13.  Follow up 4 to 5 months.

## 2022-12-11 ENCOUNTER — Telehealth: Payer: BC Managed Care – PPO | Admitting: Physician Assistant

## 2022-12-11 DIAGNOSIS — R3989 Other symptoms and signs involving the genitourinary system: Secondary | ICD-10-CM

## 2022-12-11 MED ORDER — CEPHALEXIN 500 MG PO CAPS: 500.0000 mg | ORAL_CAPSULE | Freq: Two times a day (BID) | ORAL | 0 refills | Status: DC

## 2022-12-11 NOTE — Progress Notes (Signed)

## 2022-12-23 ENCOUNTER — Telehealth: Payer: BC Managed Care – PPO | Admitting: Family Medicine

## 2022-12-23 DIAGNOSIS — J069 Acute upper respiratory infection, unspecified: Secondary | ICD-10-CM

## 2022-12-23 MED ORDER — FLUTICASONE PROPIONATE 50 MCG/ACT NA SUSP
2.0000 | Freq: Every day | NASAL | 0 refills | Status: DC
Start: 2022-12-23 — End: 2023-08-05

## 2022-12-23 MED ORDER — BENZONATATE 100 MG PO CAPS: 100.0000 mg | ORAL_CAPSULE | Freq: Three times a day (TID) | ORAL | 0 refills | Status: DC | PRN

## 2022-12-23 NOTE — Progress Notes (Signed)

## 2023-01-30 ENCOUNTER — Telehealth: Payer: BC Managed Care – PPO | Admitting: Nurse Practitioner

## 2023-01-30 DIAGNOSIS — K0889 Other specified disorders of teeth and supporting structures: Secondary | ICD-10-CM | POA: Diagnosis not present

## 2023-01-31 MED ORDER — AMOXICILLIN-POT CLAVULANATE 875-125 MG PO TABS: 1.0000 | ORAL_TABLET | Freq: Two times a day (BID) | ORAL | 0 refills | Status: DC

## 2023-01-31 MED ORDER — NAPROXEN 500 MG PO TABS: 500.0000 mg | ORAL_TABLET | Freq: Two times a day (BID) | ORAL | 1 refills | Status: DC

## 2023-01-31 NOTE — Progress Notes (Signed)
E-Visit for Dental Pain  We are sorry that you are not feeling well.  Here is how we plan to help!  Based on what you have shared with me in the questionnaire, it sounds like you have dental pain  Augmentin 875-'125mg'$  twice a day for 7 days and Naprosyn '500mg'$  2 times a day for 7 days for discomfort  It is imperative that you see a dentist within 10 days of this eVisit to determine the cause of the dental pain and be sure it is adequately treated  A toothache or tooth pain is caused when the nerve in the root of a tooth or surrounding a tooth is irritated. Dental (tooth) infection, decay, injury, or loss of a tooth are the most common causes of dental pain. Pain may also occur after an extraction (tooth is pulled out). Pain sometimes originates from other areas and radiates to the jaw, thus appearing to be tooth pain.Bacteria growing inside your mouth can contribute to gum disease and dental decay, both of which can cause pain. A toothache occurs from inflammation of the central portion of the tooth called pulp. The pulp contains nerve endings that are very sensitive to pain. Inflammation to the pulp or pulpitis may be caused by dental cavities, trauma, and infection.    HOME CARE:   For toothaches: Over-the-counter pain medications such as acetaminophen or ibuprofen may be used. Take these as directed on the package while you arrange for a dental appointment. Avoid very cold or hot foods, because they may make the pain worse. You may get relief from biting on a cotton ball soaked in oil of cloves. You can get oil of cloves at most drug stores.  For jaw pain:  Aspirin may be helpful for problems in the joint of the jaw in adults. If pain happens every time you open your mouth widely, the temporomandibular joint (TMJ) may be the source of the pain. Yawning or taking a large bite of food may worsen the pain. An appointment with your doctor or dentist will help you find the cause.     GET HELP  RIGHT AWAY IF:  You have a high fever or chills If you have had a recent head or face injury and develop headache, light headedness, nausea, vomiting, or other symptoms that concern you after an injury to your face or mouth, you could have a more serious injury in addition to your dental injury. A facial rash associated with a toothache: This condition may improve with medication. Contact your doctor for them to decide what is appropriate. Any jaw pain occurring with chest pain: Although jaw pain is most commonly caused by dental disease, it is sometimes referred pain from other areas. People with heart disease, especially people who have had stents placed, people with diabetes, or those who have had heart surgery may have jaw pain as a symptom of heart attack or angina. If your jaw or tooth pain is associated with lightheadedness, sweating, or shortness of breath, you should see a doctor as soon as possible. Trouble swallowing or excessive pain or bleeding from gums: If you have a history of a weakened immune system, diabetes, or steroid use, you may be more susceptible to infections. Infections can often be more severe and extensive or caused by unusual organisms. Dental and gum infections in people with these conditions may require more aggressive treatment. An abscess may need draining or IV antibiotics, for example.  MAKE SURE YOU   Understand these instructions. Will  watch your condition. Will get help right away if you are not doing well or get worse.  Thank you for choosing an e-visit.  Your e-visit answers were reviewed by a board certified advanced clinical practitioner to complete your personal care plan. Depending upon the condition, your plan could have included both over the counter or prescription medications.  Please review your pharmacy choice. Make sure the pharmacy is open so you can pick up prescription now. If there is a problem, you may contact your provider through [employer] and have the prescription routed to another pharmacy.  Your safety is important to [place]. If you have drug allergies check your prescription carefully.   For the next 24 hours you can use MyChart to ask questions about today's visit, request a non-urgent call back, or ask for a work or school excuse. You will get an email in the next two days asking about your experience. I hope that your e-visit has been valuable and will speed your recovery.  Mary-Margaret Hassell Done, FNP   5-10 minutes spent reviewing and documenting in chart.

## 2023-02-03 ENCOUNTER — Telehealth: Payer: BC Managed Care – PPO | Admitting: Family Medicine

## 2023-02-03 DIAGNOSIS — B379 Candidiasis, unspecified: Secondary | ICD-10-CM

## 2023-02-03 MED ORDER — FLUCONAZOLE 150 MG PO TABS
150.0000 mg | ORAL_TABLET | Freq: Every day | ORAL | 0 refills | Status: DC
Start: 2023-02-03 — End: 2023-02-06

## 2023-02-03 NOTE — Progress Notes (Signed)

## 2023-02-06 ENCOUNTER — Encounter: Payer: Self-pay | Admitting: Family Medicine

## 2023-02-06 ENCOUNTER — Other Ambulatory Visit (HOSPITAL_COMMUNITY)
Admission: RE | Admit: 2023-02-06 | Discharge: 2023-02-06 | Disposition: A | Discharge status: Home / Self Care | Payer: BC Managed Care – PPO | Source: Ambulatory Visit | Attending: Family Medicine | Admitting: Family Medicine

## 2023-02-06 ENCOUNTER — Ambulatory Visit (INDEPENDENT_AMBULATORY_CARE_PROVIDER_SITE_OTHER): Payer: BC Managed Care – PPO | Admitting: Family Medicine

## 2023-02-06 VITALS — BP 92/64 | HR 71 | Temp 98.8°F | Ht 64.5 in | Wt 159.2 lb

## 2023-02-06 DIAGNOSIS — Z0000 Encounter for general adult medical examination without abnormal findings: Secondary | ICD-10-CM

## 2023-02-06 DIAGNOSIS — Z124 Encounter for screening for malignant neoplasm of cervix: Secondary | ICD-10-CM | POA: Insufficient documentation

## 2023-02-06 DIAGNOSIS — T3695XA Adverse effect of unspecified systemic antibiotic, initial encounter: Secondary | ICD-10-CM

## 2023-02-06 DIAGNOSIS — N760 Acute vaginitis: Secondary | ICD-10-CM

## 2023-02-06 DIAGNOSIS — M62830 Muscle spasm of back: Secondary | ICD-10-CM

## 2023-02-06 DIAGNOSIS — B379 Candidiasis, unspecified: Secondary | ICD-10-CM | POA: Diagnosis not present

## 2023-02-06 LAB — POCT URINALYSIS DIPSTICK
Bilirubin, UA: NEGATIVE
Blood, UA: NEGATIVE
Glucose, UA: NEGATIVE
Ketones, UA: NEGATIVE
Leukocytes, UA: NEGATIVE
Nitrite, UA: NEGATIVE
Protein, UA: NEGATIVE
Spec Grav, UA: 1.015 (ref 1.010–1.025)
Urobilinogen, UA: 0.2 E.U./dL
pH, UA: 7 (ref 5.0–8.0)

## 2023-02-06 MED ORDER — FLUCONAZOLE 150 MG PO TABS: ORAL_TABLET | ORAL | 0 refills | Status: DC

## 2023-02-06 NOTE — Progress Notes (Signed)
Established Patient Office Visit   Subjective  Patient ID: Holly Abbott, female    DOB: 1996/09/14  Age: 27 y.o. MRN: [id number]  Chief Complaint  Patient presents with   Annual Exam    Pt is a 27 yo female with no sig pmh who was seen for CPE.  Pt with vaginitis s/p abx prior to dental procedure.  Took one diflucan, but still having d/c and irritation.  Pain resolved.  Pt also notes muscle spasm in L lower back.  Denies injury, heavy lifting, pushing, pulling.  Does toss and turn at night.  Has not had to take anything for symptoms.      ROS Negative unless stated above    Objective:     BP 92/64 (BP Location: Right Arm, Patient Position: Sitting, Cuff Size: Large)   Pulse 71   Temp 98.8 F (37.1 C) (Oral)   Ht 5' 4.5" (1.638 m)   Wt 159 lb 3.2 oz (72.2 kg)   LMP 01/14/2023 (Exact Date)   SpO2 94%   BMI 26.90 kg/m    Physical Exam Constitutional:      Appearance: Normal appearance.  HENT:     Head: Normocephalic and atraumatic.     Right Ear: Tympanic membrane, ear canal and external ear normal.     Left Ear: Tympanic membrane, ear canal and external ear normal.     Nose: Nose normal.     Mouth/Throat:     Mouth: Mucous membranes are moist.     Pharynx: No oropharyngeal exudate or posterior oropharyngeal erythema.  Eyes:     General: No scleral icterus.    Extraocular Movements: Extraocular movements intact.     Conjunctiva/sclera: Conjunctivae normal.     Pupils: Pupils are equal, round, and reactive to light.  Neck:     Thyroid: No thyromegaly.  Cardiovascular:     Rate and Rhythm: Normal rate and regular rhythm.     Pulses: Normal pulses.     Heart sounds: Normal heart sounds. No murmur heard.    No friction rub.  Pulmonary:     Effort: Pulmonary effort is normal.     Breath sounds: Normal breath sounds. No wheezing, rhonchi or rales.  Abdominal:     General: Bowel sounds are normal.     Palpations: Abdomen is soft.     Tenderness: There is  no abdominal tenderness.  Genitourinary:    Vagina: Vaginal discharge and erythema present.     Cervix: Discharge and erythema present.     Uterus: Normal.      Comments: Erythema of b/l labia majora.  No rash.  Normma urethral meatus, perineum, rectum. Musculoskeletal:        General: No deformity. Normal range of motion.  Lymphadenopathy:     Cervical: No cervical adenopathy.  Skin:    General: Skin is warm and dry.     Findings: No lesion.  Neurological:     General: No focal deficit present.     Mental Status: She is alert and oriented to person, place, and time.  Psychiatric:        Mood and Affect: Mood normal.        Thought Content: Thought content normal.      No results found for any visits on 02/06/23.    Assessment & Plan:  Well adult exam -Anticipatory guidance given including wearing seatbelts, smoke detectors in the home, increasing physical activity, increasing p.o. intake of water and vegetables. -labs -immunizations reviewed. -pap  done this visit -next CPE in 1 yr -     Cytology - PAP -     Hemoglobin A1c -     Lipid panel -     Comprehensive metabolic panel  Cervical cancer screening -     Cytology - PAP  Antibiotic-induced yeast infection -extend diflucan course -OTC antifungal cream for external pruritus. -     Fluconazole; Take 1 tab now.  Repeat dose in 3 days if needed.  Dispense: 2 tablet; Refill: 0  Acute vaginitis -2/2 abx induced yeast infection -     POCT urinalysis dipstick  Muscle spasm of back -obtain UA to r/o UTI -supportive care, heat, ice, NSAIDs, topical analgesics, etc. -     POCT urinalysis dipstick    Return if symptoms worsen or fail to improve.   Billie Ruddy, MD

## 2023-02-06 NOTE — Addendum Note (Signed)
Addended by: Rosalyn Gess D on: 02/06/2023 02:01 PM   Modules accepted: Orders

## 2023-02-13 LAB — CYTOLOGY - PAP
Adequacy: ABSENT
Comment: NEGATIVE
Diagnosis: UNDETERMINED — AB
High risk HPV: NEGATIVE

## 2023-02-17 ENCOUNTER — Encounter: Payer: Self-pay | Admitting: Family Medicine

## 2023-02-17 ENCOUNTER — Ambulatory Visit: Payer: BC Managed Care – PPO | Admitting: Neurology

## 2023-03-25 NOTE — Progress Notes (Signed)
NEUROLOGY FOLLOW UP OFFICE NOTE  Holly Abbott [id number]  Assessment/Plan:   Menstrually related migraines, with status migrainosus, not intractable - doing well   Migraine prevention:  note indicated Migraine rescue:  Rizatriptan 5mg .  Zofran ODT 4mg  for nausea  Limit use of pain relievers to no more than 2 days out of week to prevent risk of rebound or medication-overuse headache. Keep headache diary Caffeine cessation, hydration, exercise, sleep hygiene Follow up 6 months     Subjective:  Holly Abbott is a 27 year old right-handed female who follows up for migraine.  UPDATE: Decided not to start the nortriptyline.  Stress decreased after putting school on-hold.  Headaches improved.    Intensity:  mild-moderate Duration:  30 minutes with rizatriptan and Zofran.   Frequency:  3-4 migraines over last 5 months (associated with start of ovulation). Current NSAIDS/analgesics:  Excedrin Migraine (rarely) Current triptans:  rizatriptan 5mg  Current ergotamine:  none Current anti-emetic:  Zofran ODT 4mg  Current muscle relaxants:  none Current Antihypertensive medications:  none Current Antidepressant medications:  none Current Anticonvulsant medications:  none Current anti-CGRP:  none Current Vitamins/Herbal/Supplements:  none Current Antihistamines/Decongestants:  none Other therapy:  none Birth control:  none     Caffeine:  1 tall or grande size cup of coffee but not daily; sometimes Mt Dew Alcohol:  wine occasionally Smoker:  no Diet:  16 oz water daily.  Skips meals Exercise:  no Depression:  no; Anxiety:  yes.  Mother, in grad school to get her MBA, and works as Curator (works remote, goes into office once a week) Other pain:  no Sleep hygiene:  improved.  Improved stress.  Out of grad school now.    HISTORY:  Onset:  Occasionally up until her late 29s. Became more frequent after having her daughter in July 2022 Location:  usually right  frontal (sometimes left-sided),sometimes may radiate down the right side of her neck, sometimes dull pain in middle of head Quality:  throbbing Intensity:  mild to severe.   Aura:  absent Prodrome:  absent Associated symptoms:  Nausea, photophobia, sees floaters.  She denies associated vomiting, phonophobia, unilateral numbness or weakness. Duration:  30 minutes with rizatriptan, otherwise all day, however around her cycle it can last about a week Frequency:  daily (severe headaches occur 5 to 7 days a month) Frequency of abortive medication: Excedrin or rizatriptan 1 to 2 days a week Triggers:  stress, 1-2 weeks before per period and lasts until 3 days before her period Relieving factors:  rest, keeping still, dark room Activity:  aggravates with severe   CT head on 10/01/2022 personally reviewed revealed partially empty sella but no acute intracranial abnormality.   Past NSAIDS/analgesics:  ibuprofen, Fioricet Past abortive triptans:  none Past abortive ergotamine:  none Past muscle relaxants:  none Past anti-emetic:  Zofran 4mg  Past antihypertensive medications:  none Past antidepressant medications:  none Past anticonvulsant medications:  none Past anti-CGRP:  none Past vitamins/Herbal/Supplements:  none Past antihistamines/decongestants:  Flonase Other past therapies:  none    Family history of headache:  mom (she also has seizure disorder and multiple sclerosis)  PAST MEDICAL HISTORY: Past Medical History:  Diagnosis Date   Anemia    History of pre-eclampsia    Pilonidal cyst    Scoliosis    Pt known as a child; MD @ bedside when notified     MEDICATIONS: Current Outpatient Medications on File Prior to Visit  Medication Sig Dispense Refill  amoxicillin (AMOXIL) 500 MG tablet Take 500 mg by mouth 3 (three) times daily.     benzonatate (TESSALON) 100 MG capsule Take 1 capsule (100 mg total) by mouth 3 (three) times daily as needed for cough. (Patient not taking:  Reported on 02/06/2023) 20 capsule 0   fluconazole (DIFLUCAN) 150 MG tablet Take 1 tab now.  Repeat dose in 3 days if needed. 2 tablet 0   fluticasone (FLONASE) 50 MCG/ACT nasal spray Place 2 sprays into both nostrils daily. (Patient not taking: Reported on 02/06/2023) 16 g 0   naproxen (NAPROSYN) 500 MG tablet Take 1 tablet (500 mg total) by mouth 2 (two) times daily with a meal. (Patient not taking: Reported on 02/06/2023) 60 tablet 1   nortriptyline (PAMELOR) 10 MG capsule Take 1 capsule (10 mg total) by mouth at bedtime. (Patient not taking: Reported on 02/06/2023) 30 capsule 5   ondansetron (ZOFRAN-ODT) 4 MG disintegrating tablet Take 1 tablet (4 mg total) by mouth every 8 (eight) hours as needed for nausea or vomiting. 20 tablet 5   rizatriptan (MAXALT) 5 MG tablet Take 1 tablet (5 mg total) by mouth as needed for migraine. May repeat in 2 hours if needed 10 tablet 2   No current facility-administered medications on file prior to visit.    ALLERGIES: No Known Allergies  FAMILY HISTORY: Family History  Problem Relation Age of Onset   Seizures Mother    Migraines Mother    Arthritis Mother    Multiple sclerosis Mother    Asthma Father    Diabetes Maternal Grandmother    Stroke Maternal Grandfather    Diabetes Maternal Grandfather    Asthma Paternal Grandmother    Cancer Paternal Grandfather    Hypertension Neg Hx       Objective:  Blood pressure 101/61, pulse 72, height 5\' 4"  (1.626 m), weight 164 lb (74.4 kg), SpO2 98 %, not currently breastfeeding. General: No acute distress.  Patient appears well-groomed.      Shon Millet, DO  CC: Abbe Amsterdam, MD

## 2023-03-27 ENCOUNTER — Ambulatory Visit: Payer: BC Managed Care – PPO | Admitting: Neurology

## 2023-03-27 ENCOUNTER — Encounter: Payer: Self-pay | Admitting: Neurology

## 2023-03-27 VITALS — BP 101/61 | HR 72 | Ht 64.0 in | Wt 164.0 lb

## 2023-03-27 DIAGNOSIS — G43821 Menstrual migraine, not intractable, with status migrainosus: Secondary | ICD-10-CM | POA: Diagnosis not present

## 2023-04-27 ENCOUNTER — Telehealth: Payer: BC Managed Care – PPO | Admitting: Physician Assistant

## 2023-04-27 DIAGNOSIS — B9689 Other specified bacterial agents as the cause of diseases classified elsewhere: Secondary | ICD-10-CM | POA: Diagnosis not present

## 2023-04-27 DIAGNOSIS — J019 Acute sinusitis, unspecified: Secondary | ICD-10-CM | POA: Diagnosis not present

## 2023-04-27 MED ORDER — DOXYCYCLINE HYCLATE 100 MG PO TABS: 100.0000 mg | ORAL_TABLET | Freq: Two times a day (BID) | ORAL | 0 refills | Status: DC

## 2023-04-27 NOTE — Progress Notes (Signed)
I have spent 5 minutes in review of e-visit questionnaire, review and updating patient chart, medical decision making and response to patient.   Paulena Servais Cody Kenyon Eshleman, PA-C    

## 2023-04-27 NOTE — Progress Notes (Signed)

## 2023-04-29 ENCOUNTER — Ambulatory Visit: Payer: BC Managed Care – PPO | Admitting: Family Medicine

## 2023-08-05 ENCOUNTER — Telehealth: Payer: BC Managed Care – PPO | Admitting: Physician Assistant

## 2023-08-05 DIAGNOSIS — R3989 Other symptoms and signs involving the genitourinary system: Secondary | ICD-10-CM

## 2023-08-05 MED ORDER — CEPHALEXIN 500 MG PO CAPS: 500.0000 mg | ORAL_CAPSULE | Freq: Two times a day (BID) | ORAL | 0 refills | Status: AC

## 2023-08-05 NOTE — Progress Notes (Signed)
I have spent 5 minutes in review of e-visit questionnaire, review and updating patient chart, medical decision making and response to patient.   William Cody Martin, PA-C    

## 2023-08-05 NOTE — Progress Notes (Signed)

## 2023-08-24 ENCOUNTER — Encounter (HOSPITAL_BASED_OUTPATIENT_CLINIC_OR_DEPARTMENT_OTHER): Payer: Self-pay | Admitting: Emergency Medicine

## 2023-08-24 ENCOUNTER — Emergency Department (HOSPITAL_BASED_OUTPATIENT_CLINIC_OR_DEPARTMENT_OTHER)
Admission: EM | Admit: 2023-08-24 | Discharge: 2023-08-24 | Disposition: A | Discharge status: Home / Self Care | Payer: BC Managed Care – PPO | Attending: Emergency Medicine | Admitting: Emergency Medicine

## 2023-08-24 ENCOUNTER — Other Ambulatory Visit: Payer: Self-pay

## 2023-08-24 DIAGNOSIS — R519 Headache, unspecified: Secondary | ICD-10-CM | POA: Insufficient documentation

## 2023-08-24 MED ORDER — SODIUM CHLORIDE 0.9 % IV BOLUS
1000.0000 mL | Freq: Once | INTRAVENOUS | Status: AC
  Administered 2023-08-24: 1000 mL via INTRAVENOUS

## 2023-08-24 MED ORDER — KETOROLAC TROMETHAMINE 30 MG/ML IJ SOLN
30.0000 mg | Freq: Once | INTRAMUSCULAR | Status: AC
  Administered 2023-08-24: 30 mg via INTRAVENOUS
  Filled 2023-08-24: qty 1

## 2023-08-24 MED ORDER — METOCLOPRAMIDE HCL 5 MG/ML IJ SOLN
10.0000 mg | Freq: Once | INTRAMUSCULAR | Status: AC
  Administered 2023-08-24: 10 mg via INTRAVENOUS
  Filled 2023-08-24: qty 2

## 2023-08-24 MED ORDER — DEXAMETHASONE SODIUM PHOSPHATE 10 MG/ML IJ SOLN
10.0000 mg | Freq: Once | INTRAMUSCULAR | Status: AC
  Administered 2023-08-24: 10 mg via INTRAVENOUS
  Filled 2023-08-24: qty 1

## 2023-08-24 MED ORDER — DIPHENHYDRAMINE HCL 50 MG/ML IJ SOLN
25.0000 mg | Freq: Once | INTRAMUSCULAR | Status: AC
  Administered 2023-08-24: 25 mg via INTRAVENOUS
  Filled 2023-08-24: qty 1

## 2023-08-24 NOTE — ED Triage Notes (Signed)
Pt states had headache yesterday, ate something and it got better until bedtime. Tried to sleep woke up about 0230 and took Excedrin migraine with some relief. Hx of same. Presents with headache, light headedness and nausea.

## 2023-08-24 NOTE — ED Provider Notes (Signed)
[facility] AT [facility] Provider Note   CSN: [id number] Arrival date & time: 08/24/23  0355     History  Chief Complaint  Patient presents with   Migraine    Holly Abbott is a 27 y.o. female.  Patient is a 27 year old female with history of migraines.  Patient presenting today for evaluation of headache.  She describes a right-sided headache she describes as a throbbing that has been worsening since yesterday.  She feels nauseated, but has not vomited.  No visual disturbances.  No weakness or numbness.  She has tried taking her Maxalt, however this has not helped.  The history is provided by the patient.       Home Medications Prior to Admission medications   Medication Sig Start Date End Date Taking? Authorizing Provider  naproxen (NAPROSYN) 500 MG tablet Take 1 tablet (500 mg total) by mouth 2 (two) times daily with a meal. Patient not taking: Reported on 02/06/2023 01/31/23   Bennie Pierini, FNP  ondansetron (ZOFRAN-ODT) 4 MG disintegrating tablet Take 1 tablet (4 mg total) by mouth every 8 (eight) hours as needed for nausea or vomiting. 11/05/22   Drema Dallas, DO  rizatriptan (MAXALT) 5 MG tablet Take 1 tablet (5 mg total) by mouth as needed for migraine. May repeat in 2 hours if needed 09/24/22   Deeann Saint, MD      Allergies    Patient has no known allergies.    Review of Systems   Review of Systems  All other systems reviewed and are negative.   Physical Exam Updated Vital Signs BP 110/82 (BP Location: Right Arm)   Pulse 72   Temp 98.2 F (36.8 C) (Oral)   Resp 16   Ht 5\' 4"  (1.626 m)   Wt 72.6 kg   LMP 08/18/2023 (Exact Date)   SpO2 99%   BMI 27.46 kg/m  Physical Exam Vitals and nursing note reviewed.  Constitutional:      General: She is not in acute distress.    Appearance: She is well-developed. She is not diaphoretic.  HENT:     Head: Normocephalic and atraumatic.  Eyes:     Extraocular  Movements: Extraocular movements intact.     Pupils: Pupils are equal, round, and reactive to light.  Cardiovascular:     Rate and Rhythm: Normal rate and regular rhythm.     Heart sounds: No murmur heard.    No friction rub. No gallop.  Pulmonary:     Effort: Pulmonary effort is normal. No respiratory distress.     Breath sounds: Normal breath sounds. No wheezing.  Abdominal:     General: Bowel sounds are normal. There is no distension.     Palpations: Abdomen is soft.     Tenderness: There is no abdominal tenderness.  Musculoskeletal:        General: Normal range of motion.     Cervical back: Normal range of motion and neck supple.  Skin:    General: Skin is warm and dry.  Neurological:     General: No focal deficit present.     Mental Status: She is alert and oriented to person, place, and time. Mental status is at baseline.     Cranial Nerves: No cranial nerve deficit.     Motor: No weakness.     Coordination: Coordination normal.     Gait: Gait normal.     ED Results / Procedures / Treatments   Labs (all  labs ordered are listed, but only abnormal results are displayed) Labs Reviewed - No data to display  EKG None  Radiology No results found.  Procedures Procedures    Medications Ordered in ED Medications  sodium chloride 0.9 % bolus 1,000 mL (has no administration in time range)  ketorolac (TORADOL) 30 MG/ML injection 30 mg (has no administration in time range)  diphenhydrAMINE (BENADRYL) injection 25 mg (has no administration in time range)  metoCLOPramide (REGLAN) injection 10 mg (has no administration in time range)  dexamethasone (DECADRON) injection 10 mg (has no administration in time range)    ED Course/ Medical Decision Making/ A&P  Patient is a 27 year old female presenting with complaints of a cat as described in the HPI.  She arrives here with stable vital signs and is afebrile.  She is neurologically intact.  Patient given a migraine cocktail  with good results.  She now states that she feels considerably improved.  She will be discharged with continued use of her home meds as needed.  Final Clinical Impression(s) / ED Diagnoses Final diagnoses:  None    Rx / DC Orders ED Discharge Orders     None         Geoffery Lyons, MD 08/24/23 [phone]

## 2023-08-24 NOTE — Discharge Instructions (Signed)
Continue home medications as needed for headache.  Follow-up with your primary doctor/neurologist if symptoms persist.

## 2023-08-27 ENCOUNTER — Ambulatory Visit: Payer: BC Managed Care – PPO | Admitting: Family Medicine

## 2023-08-27 VITALS — BP 110/74 | HR 66 | Temp 98.4°F | Ht 64.0 in | Wt 157.8 lb

## 2023-08-27 DIAGNOSIS — G43829 Menstrual migraine, not intractable, without status migrainosus: Secondary | ICD-10-CM | POA: Diagnosis not present

## 2023-08-27 DIAGNOSIS — N760 Acute vaginitis: Secondary | ICD-10-CM

## 2023-08-27 MED ORDER — FLUCONAZOLE 150 MG PO TABS
ORAL_TABLET | ORAL | 0 refills | Status: DC
Start: 2023-08-27 — End: 2024-01-10

## 2023-08-27 NOTE — Progress Notes (Signed)
Established Patient Office Visit   Subjective  Patient ID: Holly Abbott, female    DOB: 10/19/1996  Age: 27 y.o. MRN: [id number]  Chief Complaint  Patient presents with   GI Problem    Patient was seen in the ED on Monday and give meds by IV, patient states after she started having Vomiting Nausea, Diarrhea, and possible yeast infection      Patient is a 27 year old female seen for ED follow-up.  Patient seen in ED on 9/30 for continued migraine with nausea that started the day prior.  Patient states she was given a headache cocktail and fluids.  Patient states she noted vaginal irritation as soon as she was given Decadron via IV.  Ever since pt endorses vaginal irritation, thick white discharge.  Since ED visit headache continued Monday, Tuesday and improved by Wednesday.  Patient continues to feel fatigued, has no appetite.  Patient had nausea and episode of emesis.  Endorses 4 loose stools this morning.  Was able to eat grits this morning.  Staying hydrated.  Starting to feel hungry.  Has neurology appointment on Monday.  States typically has a migraine at the start of menses.  Recent migraine occurred at the end of menses.  Has prescription for Maxalt and Zofran.  States Maxalt only works if taken immediately at start of headache, otherwise does not help.  Patient will take Excedrin Migraine if headache is already progressed  GI Problem    Patient Active Problem List   Diagnosis Date Noted   Term pregnancy 06/18/2021   SVD (spontaneous vaginal delivery) 06/18/2021   Respiratory tract infection due to COVID-19 virus 06/27/2020   Postpartum state 06/20/2016   PROM (premature rupture of membranes) 06/19/2016   Pilonidal abscess, I&D 10/10 09/03/2011   Past Medical History:  Diagnosis Date   Anemia    History of pre-eclampsia    Pilonidal cyst    Scoliosis    Pt known as a child; MD @ bedside when notified    Past Surgical History:  Procedure Laterality Date   INCISE AND  DRAIN ABCESS  2012   pilonidal cyst   removal of cyst     WISDOM TOOTH EXTRACTION     Social History   Tobacco Use   Smoking status: Never   Smokeless tobacco: Never  Vaping Use   Vaping status: Never Used  Substance Use Topics   Alcohol use: No   Drug use: No   Family History  Problem Relation Age of Onset   Seizures Mother    Migraines Mother    Arthritis Mother    Multiple sclerosis Mother    Asthma Father    Diabetes Maternal Grandmother    Stroke Maternal Grandfather    Diabetes Maternal Grandfather    Asthma Paternal Grandmother    Cancer Paternal Grandfather    Hypertension Neg Hx    No Known Allergies    ROS Negative unless stated above    Objective:     BP 110/74 (BP Location: Left Arm, Patient Position: Sitting, Cuff Size: Normal)   Pulse 66   Temp 98.4 F (36.9 C) (Oral)   Ht 5\' 4"  (1.626 m)   Wt 157 lb 12.8 oz (71.6 kg)   LMP 08/18/2023 (Exact Date)   SpO2 96%   BMI 27.09 kg/m  BP Readings from Last 3 Encounters:  08/27/23 110/74  08/24/23 110/82  03/27/23 101/61   Wt Readings from Last 3 Encounters:  08/27/23 157 lb 12.8 oz (71.6 kg)  08/24/23 160 lb (72.6 kg)  03/27/23 164 lb (74.4 kg)      Physical Exam Constitutional:      General: She is not in acute distress.    Appearance: Normal appearance.  HENT:     Head: Normocephalic and atraumatic.     Nose: Nose normal.     Mouth/Throat:     Mouth: Mucous membranes are moist.  Eyes:     Extraocular Movements: Extraocular movements intact.     Conjunctiva/sclera: Conjunctivae normal.     Pupils: Pupils are equal, round, and reactive to light.  Cardiovascular:     Rate and Rhythm: Normal rate and regular rhythm.  Pulmonary:     Effort: Pulmonary effort is normal.  Skin:    General: Skin is warm and dry.  Neurological:     Mental Status: She is alert and oriented to person, place, and time. Mental status is at baseline.      No results found for any visits on 08/27/23.     Assessment & Plan:  Menstrual migraine without status migrainosus, not intractable  Acute vaginitis -     Fluconazole; Take 1 tab now.  Repeat dose in 3 days if needed for continued symptoms.  Dispense: 2 tablet; Refill: 0  Patient seen for ED follow-up.  Migraine now resolved.  Discussed the importance of starting Maxalt if needed at the start of headaches.  Can repeat dose if needed in 2 hours.  Zofran as needed for nausea.  Patient encouraged to keep follow-up appointment with neurology on Monday.  Acute vaginitis.  Start Diflucan.  Return if symptoms worsen or fail to improve.   Deeann Saint, MD

## 2023-08-28 NOTE — Progress Notes (Unsigned)
NEUROLOGY FOLLOW UP OFFICE NOTE  Gearl Kimbrough [id number]  Assessment/Plan:   Menstrually related migraines, with status migrainosus, not intractable - doing well   Perimenstrual prophylaxis:  Nurtec one daily for 8 days beginning 2 days prior to start of ovulation Migraine rescue:  Stop Rizatriptan 5mg .  Try sumatriptan 100mg .  Zofran ODT 4mg  for nausea   Limit use of pain relievers to no more than 2 days out of week to prevent risk of rebound or medication-overuse headache. Keep headache diary Caffeine cessation, hydration, exercise, sleep hygiene Follow up 4 months     Subjective:  IDELL HISSONG is a 27 year old right-handed female who follows up for migraine.  UPDATE: She had an intractable migraine last week requiring ED visit.  She didn't take the rizatriptan because it was fairly mild but when she woke up in the middle of the night, it was severe.  She thought the rizatriptan wouldn't work this far into the migraine, so she took Excedrin which didn't work.  Received migraine cocktail.  Mld headache continues off and on but continued feeling sick to her stomach.  Rizatriptan not helping.  No appetite.  Really only drinking water and ginger ale.  Onset correlates with finishing menses.    Prior to last week. Intensity:  mild-moderate Duration:  30 minutes with rizatriptan and Zofran.   Frequency:  1 to 2 migraines a month. (associated with start of ovulation). Current NSAIDS/analgesics:  Excedrin Migraine (rarely) Current triptans:  rizatriptan 5mg  Current ergotamine:  none Current anti-emetic:  Zofran ODT 4mg  Current muscle relaxants:  none Current Antihypertensive medications:  none Current Antidepressant medications:  none Current Anticonvulsant medications:  none Current anti-CGRP:  none Current Vitamins/Herbal/Supplements:  none Current Antihistamines/Decongestants:  none Other therapy:  none Birth control:  none     Caffeine:  1 tall or grande size cup  of coffee but not daily; sometimes Mt Dew Alcohol:  wine occasionally Smoker:  no Diet:  16 oz water daily.  Skips meals Exercise:  no Depression:  no; Anxiety:  yes.  Mother, in grad school to get her MBA, and works as Curator (works remote, goes into office once a week) Other pain:  no Sleep hygiene:  improved.  Improved stress.  Out of grad school now.    HISTORY:  Onset:  Occasionally up until her late 24s. Became more frequent after having her daughter in July 2022 Location:  usually right frontal (sometimes left-sided),sometimes may radiate down the right side of her neck, sometimes dull pain in middle of head Quality:  throbbing Intensity:  mild to severe.   Aura:  absent Prodrome:  absent Associated symptoms:  Nausea, photophobia, sees floaters.  She denies associated vomiting, phonophobia, unilateral numbness or weakness. Duration:  30 minutes with rizatriptan, otherwise all day, however around her cycle it can last about a week Frequency:  daily (severe headaches occur 5 to 7 days a month) Frequency of abortive medication: Excedrin or rizatriptan 1 to 2 days a week Triggers:  stress, 1-2 weeks before per period and lasts until 3 days before her period Relieving factors:  rest, keeping still, dark room Activity:  aggravates with severe   CT head on 10/01/2022 personally reviewed revealed partially empty sella but no acute intracranial abnormality.   Past NSAIDS/analgesics:  ibuprofen, Fioricet Past abortive triptans:  none Past abortive ergotamine:  none Past muscle relaxants:  none Past anti-emetic:  Zofran 4mg  Past antihypertensive medications:  none Past antidepressant medications:  none Past  anticonvulsant medications:  none Past anti-CGRP:  none Past vitamins/Herbal/Supplements:  none Past antihistamines/decongestants:  Flonase Other past therapies:  none    Family history of headache:  mom (she also has seizure disorder and multiple  sclerosis)  PAST MEDICAL HISTORY: Past Medical History:  Diagnosis Date   Anemia    History of pre-eclampsia    Pilonidal cyst    Scoliosis    Pt known as a child; MD @ bedside when notified     MEDICATIONS: Current Outpatient Medications on File Prior to Visit  Medication Sig Dispense Refill   albuterol (VENTOLIN HFA) 108 (90 Base) MCG/ACT inhaler INL 2 PFS ITL Q 6 H PRN     amoxicillin (AMOXIL) 500 MG tablet TAKE 1 TABLET BY MOUTH THREE TIMES DAILY UNTIL ALL TAKEN     fluconazole (DIFLUCAN) 150 MG tablet Take 1 tab now.  Repeat dose in 3 days if needed for continued symptoms. 2 tablet 0   naproxen (NAPROSYN) 500 MG tablet Take 1 tablet (500 mg total) by mouth 2 (two) times daily with a meal. 60 tablet 1   ondansetron (ZOFRAN-ODT) 4 MG disintegrating tablet Take 1 tablet (4 mg total) by mouth every 8 (eight) hours as needed for nausea or vomiting. 20 tablet 5   rizatriptan (MAXALT) 5 MG tablet Take 1 tablet (5 mg total) by mouth as needed for migraine. May repeat in 2 hours if needed 10 tablet 2   No current facility-administered medications on file prior to visit.    ALLERGIES: No Known Allergies  FAMILY HISTORY: Family History  Problem Relation Age of Onset   Seizures Mother    Migraines Mother    Arthritis Mother    Multiple sclerosis Mother    Asthma Father    Diabetes Maternal Grandmother    Stroke Maternal Grandfather    Diabetes Maternal Grandfather    Asthma Paternal Grandmother    Cancer Paternal Grandfather    Hypertension Neg Hx       Objective:  Blood pressure 92/69, pulse 80, height 5\' 4"  (1.626 m), weight 160 lb (72.6 kg), last menstrual period 08/18/2023, SpO2 99%. General: No acute distress.  Patient appears well-groomed.       Shon Millet, DO  CC: Abbe Amsterdam, MD

## 2023-08-31 ENCOUNTER — Encounter: Payer: Self-pay | Admitting: Neurology

## 2023-08-31 ENCOUNTER — Ambulatory Visit: Payer: BC Managed Care – PPO | Admitting: Neurology

## 2023-08-31 VITALS — BP 92/69 | HR 80 | Ht 64.0 in | Wt 160.0 lb

## 2023-08-31 DIAGNOSIS — G43821 Menstrual migraine, not intractable, with status migrainosus: Secondary | ICD-10-CM | POA: Diagnosis not present

## 2023-08-31 MED ORDER — SUMATRIPTAN SUCCINATE 100 MG PO TABS: 100.0000 mg | ORAL_TABLET | ORAL | 5 refills | Status: AC | PRN

## 2023-08-31 NOTE — Patient Instructions (Signed)
Start Nurtec once daily for 8 days beginning 2 days before first day of ovulation. Instead of rizatriptan, try sumatriptan instead for migraine attack. Limit use of pain relievers to no more than 2 days out of week to prevent risk of rebound or medication-overuse headache. Keep headache diary Follow up 4 months.

## 2023-08-31 NOTE — Progress Notes (Signed)
Medication Samples have been provided to the patient.  Drug name: Nurtec       Strength: 40m g        Qty: 4  LOT: [id number]  Exp.Date: 10/2024  Dosing instructions: as needed  The patient has been instructed regarding the correct time, dose, and frequency of taking this medication, including desired effects and most common side effects.   Leida Lauth 10:25 AM 08/31/2023

## 2023-09-28 ENCOUNTER — Ambulatory Visit: Payer: BC Managed Care – PPO | Admitting: Neurology

## 2024-01-01 ENCOUNTER — Ambulatory Visit: Payer: BC Managed Care – PPO | Admitting: Neurology

## 2024-01-08 ENCOUNTER — Encounter: Payer: Self-pay | Admitting: Family Medicine

## 2024-01-10 ENCOUNTER — Telehealth: Payer: Self-pay | Admitting: Nurse Practitioner

## 2024-01-10 DIAGNOSIS — N760 Acute vaginitis: Secondary | ICD-10-CM

## 2024-01-10 DIAGNOSIS — B3731 Acute candidiasis of vulva and vagina: Secondary | ICD-10-CM

## 2024-01-10 MED ORDER — FLUCONAZOLE 150 MG PO TABS: ORAL_TABLET | ORAL | 0 refills | Status: DC

## 2024-01-10 NOTE — Progress Notes (Signed)
 I have spent 5 minutes in review of e-visit questionnaire, review and updating patient chart, medical decision making and response to patient.   Claiborne Rigg, NP

## 2024-01-10 NOTE — Progress Notes (Signed)

## 2024-01-28 ENCOUNTER — Other Ambulatory Visit: Payer: Self-pay | Admitting: Family Medicine

## 2024-01-28 DIAGNOSIS — G43829 Menstrual migraine, not intractable, without status migrainosus: Secondary | ICD-10-CM

## 2024-06-07 ENCOUNTER — Other Ambulatory Visit: Payer: Self-pay | Admitting: Family Medicine

## 2024-06-07 DIAGNOSIS — G43829 Menstrual migraine, not intractable, without status migrainosus: Secondary | ICD-10-CM

## 2024-06-08 ENCOUNTER — Encounter: Payer: Self-pay | Admitting: Neurology

## 2024-06-20 NOTE — Progress Notes (Unsigned)
 NEUROLOGY FOLLOW UP OFFICE NOTE  Holly Abbott [id number]  Assessment/Plan:   Menstrually related migraines, with status migrainosus, not intractable    Perimenstrual prophylaxis:  Nurtec one daily for 8 days beginning 2 days prior to start of ovulation Migraine rescue:  Sumatriptan  100mg .  Zofran  ODT 4mg  for nausea   Limit use of pain relievers to no more than 2 days out of week to prevent risk of rebound or medication-overuse headache. Keep headache diary Caffeine cessation, hydration, exercise, sleep hygiene Follow up ***     Subjective:  Holly Abbott is a 28 year old right-handed female who follows up for migraine.  UPDATE: *** Intensity:  mild-moderate Duration:  *** with sumatriptan  Frequency:  1 to 2 migraines a month. (associated with start of ovulation). Current NSAIDS/analgesics:  Excedrin Migraine (rarely) Current triptans: sumatriptan  100mg  Current ergotamine:  none Current anti-emetic:  Zofran  ODT 4mg  Current muscle relaxants:  none Current Antihypertensive medications:  none Current Antidepressant medications:  none Current Anticonvulsant medications:  none Current anti-CGRP:  Nurtec perimenstrual prophylaxis for 8 days beginning 2 days prior to start of ovulation Current Vitamins/Herbal/Supplements:  none Current Antihistamines/Decongestants:  none Other therapy:  none Birth control:  none     Caffeine:  1 tall or grande size cup of coffee but not daily; sometimes Mt Dew Alcohol:  wine occasionally Smoker:  no Diet:  16 oz water daily.  Skips meals Exercise:  no Depression:  no; Anxiety:  yes.  Mother, in grad school to get her MBA, and works as Curator (works remote, goes into office once a week) Other pain:  no Sleep hygiene:  improved.  Improved stress.  Out of grad school now.    HISTORY:  Onset:  Occasionally up until her late 80s. Became more frequent after having her daughter in July 2022 Location:  usually right  frontal (sometimes left-sided),sometimes may radiate down the right side of her neck, sometimes dull pain in middle of head Quality:  throbbing Intensity:  mild to severe.   Aura:  absent Prodrome:  absent Associated symptoms:  Nausea, photophobia, sees floaters.  She denies associated vomiting, phonophobia, unilateral numbness or weakness. Duration:  30 minutes with rizatriptan , otherwise all day, however around her cycle it can last about a week Frequency:  daily (severe headaches occur 5 to 7 days a month) Frequency of abortive medication: Excedrin or rizatriptan  1 to 2 days a week Triggers:  stress, 1-2 weeks before per period and lasts until 3 days before her period Relieving factors:  rest, keeping still, dark room Activity:  aggravates with severe   CT head on 10/01/2022 personally reviewed revealed partially empty sella but no acute intracranial abnormality.   Past NSAIDS/analgesics:  ibuprofen , Fioricet Past abortive triptans:  rizatriptan  Past abortive ergotamine:  none Past muscle relaxants:  none Past anti-emetic:  Zofran  4mg  Past antihypertensive medications:  none Past antidepressant medications:  none Past anticonvulsant medications:  none Past anti-CGRP:  none Past vitamins/Herbal/Supplements:  none Past antihistamines/decongestants:  Flonase  Other past therapies:  none    Family history of headache:  mom (she also has seizure disorder and multiple sclerosis)  PAST MEDICAL HISTORY: Past Medical History:  Diagnosis Date   Anemia    History of pre-eclampsia    Pilonidal cyst    Scoliosis    Pt known as a child; MD @ bedside when notified     MEDICATIONS: Current Outpatient Medications on File Prior to Visit  Medication Sig Dispense Refill   fluconazole  (  DIFLUCAN ) 150 MG tablet Take 1 tab now.  Repeat dose in 3 days if needed for continued symptoms. 2 tablet 0   naproxen  (NAPROSYN ) 500 MG tablet Take 1 tablet (500 mg total) by mouth 2 (two) times daily with a  meal. 60 tablet 1   ondansetron  (ZOFRAN -ODT) 4 MG disintegrating tablet Take 1 tablet (4 mg total) by mouth every 8 (eight) hours as needed for nausea or vomiting. 20 tablet 5   SUMAtriptan  (IMITREX ) 100 MG tablet Take 1 tablet (100 mg total) by mouth as needed for up to 1 dose for migraine. May repeat in 2 hours if headache persists or recurs.  Maximum 2 tablets in 24 hours. 10 tablet 5   No current facility-administered medications on file prior to visit.    ALLERGIES: No Known Allergies  FAMILY HISTORY: Family History  Problem Relation Age of Onset   Seizures Mother    Migraines Mother    Arthritis Mother    Multiple sclerosis Mother    Asthma Father    Diabetes Maternal Grandmother    Stroke Maternal Grandfather    Diabetes Maternal Grandfather    Asthma Paternal Grandmother    Cancer Paternal Grandfather    Hypertension Neg Hx       Objective:  *** General: No acute distress.  Patient appears well-groomed.   Head:  Normocephalic/atraumatic Neck:  Supple.  No paraspinal tenderness.  Full range of motion. Heart:  Regular rate and rhythm. Neuro:  Alert and oriented.  Speech fluent and not dysarthric.  Language intact.  CN II-XII intact.  Bulk and tone normal.  Muscle strength 5/5 throughout.  Sensation to light touch intact.  Deep tendon reflexes 2+ throughout, toes downgoing.  Gait normal.  Romberg negative.     Juliene Dunnings, DO  CC: Clotilda Single, MD

## 2024-06-21 ENCOUNTER — Ambulatory Visit: Payer: PRIVATE HEALTH INSURANCE | Admitting: Neurology

## 2024-06-21 ENCOUNTER — Encounter: Payer: Self-pay | Admitting: Neurology

## 2024-06-21 VITALS — BP 111/69 | HR 85 | Resp 20 | Wt 171.0 lb

## 2024-06-21 DIAGNOSIS — G43009 Migraine without aura, not intractable, without status migrainosus: Secondary | ICD-10-CM

## 2024-06-21 MED ORDER — ONDANSETRON 4 MG PO TBDP: 4.0000 mg | ORAL_TABLET | Freq: Three times a day (TID) | ORAL | 5 refills | Status: AC | PRN

## 2024-06-21 NOTE — Patient Instructions (Addendum)
  Take 1/2 sumatriptan  tablet at earliest onset of headache.  May repeat dose once in 2 hours if needed.  Maximum 2 doses in 24 hours. Alternatively, you may take Nurtec 1 tablet at earliest onset of migraine. You may also take it as a second line to sumatriptan  as well.  Maximum 1 tablet in 24 hours. Limit use of pain relievers to no more than 9 days out of the month.  These medications include acetaminophen , NSAIDs (ibuprofen /Advil /Motrin , naproxen /Aleve , triptans (Imitrex /sumatriptan ), Excedrin, and narcotics.  This will help reduce risk of rebound headaches. Be aware of common food triggers Routine exercise Stay adequately hydrated (aim for 64 oz water daily) Keep headache diary Maintain proper stress management Maintain proper sleep hygiene Do not skip meals Consider supplements:  magnesium  citrate 400mg  daily, riboflavin 400mg  daily, coenzyme Q10 100mg  three times daily.

## 2024-08-18 ENCOUNTER — Telehealth: Payer: Self-pay | Admitting: *Deleted

## 2024-08-18 NOTE — Telephone Encounter (Signed)
 Copied from CRM [id number]. Topic: General - Billing Inquiry >> Aug 18, 2024 11:35 AM Zy'onna H wrote: Reason for CRM: The patients insurance [employer] is stating that for her to fully enroll in their insurance plan for her she needs to provide a: PCP Network Code/Provider Code from [facility] Clotilda SAUNDERS, MD.   PCP/PCP Team Please Advise   Patient has requested to be answered through - MyChart

## 2024-09-13 DIAGNOSIS — F430 Acute stress reaction: Secondary | ICD-10-CM | POA: Diagnosis not present

## 2024-10-12 ENCOUNTER — Encounter: Payer: Self-pay | Admitting: Family Medicine

## 2024-10-31 ENCOUNTER — Telehealth: Admitting: Physician Assistant

## 2024-10-31 DIAGNOSIS — G8929 Other chronic pain: Secondary | ICD-10-CM

## 2024-10-31 NOTE — Progress Notes (Signed)
  Because of chronic upper back/shoulder pain, I feel your condition warrants further evaluation and I recommend that you be seen in a face-to-face visit.   NOTE: There will be NO CHARGE for this E-Visit   If you are having a true medical emergency, please call 911.     For an urgent face to face visit, [place] has multiple urgent care centers for your convenience.  Click the link below for the full list of locations and hours, walk-in wait times, appointment scheduling options and driving directions:  Urgent Care - [place], [place], [place], [place], [place], [place]  [place]     Your MyChart E-visit questionnaire answers were reviewed by a board certified advanced clinical practitioner to complete your personal care plan based on your specific symptoms.    Thank you for using e-Visits.

## 2024-12-07 ENCOUNTER — Telehealth: Admitting: Emergency Medicine

## 2024-12-07 DIAGNOSIS — N760 Acute vaginitis: Secondary | ICD-10-CM

## 2024-12-07 MED ORDER — FLUCONAZOLE 150 MG PO TABS: ORAL_TABLET | ORAL | 0 refills | Status: DC

## 2024-12-07 NOTE — Progress Notes (Signed)

## 2024-12-12 ENCOUNTER — Ambulatory Visit: Admitting: Family Medicine

## 2024-12-12 ENCOUNTER — Other Ambulatory Visit (HOSPITAL_COMMUNITY)
Admission: RE | Admit: 2024-12-12 | Discharge: 2024-12-12 | Disposition: A | Source: Ambulatory Visit | Attending: Family Medicine | Admitting: Family Medicine

## 2024-12-12 ENCOUNTER — Encounter: Payer: Self-pay | Admitting: Family Medicine

## 2024-12-12 VITALS — BP 98/78 | HR 86 | Temp 98.0°F | Ht 64.0 in | Wt 171.8 lb

## 2024-12-12 DIAGNOSIS — Z13220 Encounter for screening for lipoid disorders: Secondary | ICD-10-CM

## 2024-12-12 DIAGNOSIS — Z131 Encounter for screening for diabetes mellitus: Secondary | ICD-10-CM

## 2024-12-12 DIAGNOSIS — Z0000 Encounter for general adult medical examination without abnormal findings: Secondary | ICD-10-CM

## 2024-12-12 DIAGNOSIS — Z124 Encounter for screening for malignant neoplasm of cervix: Secondary | ICD-10-CM

## 2024-12-12 LAB — CBC WITH DIFFERENTIAL/PLATELET
Basophils Absolute: 0 K/uL (ref 0.0–0.1)
Basophils Relative: 0.8 % (ref 0.0–3.0)
Eosinophils Absolute: 0.1 K/uL (ref 0.0–0.7)
Eosinophils Relative: 2.5 % (ref 0.0–5.0)
HCT: 40.2 % (ref 36.0–46.0)
Hemoglobin: 13.3 g/dL (ref 12.0–15.0)
Lymphocytes Relative: 33.8 % (ref 12.0–46.0)
Lymphs Abs: 1.9 K/uL (ref 0.7–4.0)
MCHC: 33.2 g/dL (ref 30.0–36.0)
MCV: 95 fl (ref 78.0–100.0)
Monocytes Absolute: 0.5 K/uL (ref 0.1–1.0)
Monocytes Relative: 8.3 % (ref 3.0–12.0)
Neutro Abs: 3.1 K/uL (ref 1.4–7.7)
Neutrophils Relative %: 54.6 % (ref 43.0–77.0)
Platelets: 309 K/uL (ref 150.0–400.0)
RBC: 4.23 Mil/uL (ref 3.87–5.11)
RDW: 13.2 % (ref 11.5–15.5)
WBC: 5.8 K/uL (ref 4.0–10.5)

## 2024-12-12 LAB — LIPID PANEL
Cholesterol: 185 mg/dL (ref 28–200)
HDL: 49.9 mg/dL
LDL Cholesterol: 123 mg/dL — ABNORMAL HIGH (ref 10–99)
NonHDL: 135.01
Total CHOL/HDL Ratio: 4
Triglycerides: 59 mg/dL (ref 10.0–149.0)
VLDL: 11.8 mg/dL (ref 0.0–40.0)

## 2024-12-12 LAB — COMPREHENSIVE METABOLIC PANEL WITH GFR
ALT: 17 U/L (ref 3–35)
AST: 22 U/L (ref 5–37)
Albumin: 4.4 g/dL (ref 3.5–5.2)
Alkaline Phosphatase: 52 U/L (ref 39–117)
BUN: 10 mg/dL (ref 6–23)
CO2: 27 meq/L (ref 19–32)
Calcium: 9.7 mg/dL (ref 8.4–10.5)
Chloride: 104 meq/L (ref 96–112)
Creatinine, Ser: 0.89 mg/dL (ref 0.40–1.20)
GFR: 86.93 mL/min
Glucose, Bld: 84 mg/dL (ref 70–99)
Potassium: 4 meq/L (ref 3.5–5.1)
Sodium: 138 meq/L (ref 135–145)
Total Bilirubin: 0.6 mg/dL (ref 0.2–1.2)
Total Protein: 8.2 g/dL (ref 6.0–8.3)

## 2024-12-12 LAB — HEMOGLOBIN A1C: Hgb A1c MFr Bld: 5 % (ref 4.6–6.5)

## 2024-12-12 LAB — TSH: TSH: 1.17 u[IU]/mL (ref 0.35–5.50)

## 2024-12-12 NOTE — Progress Notes (Signed)
 "  Established Patient Office Visit   Subjective  Patient ID: Holly Abbott, female    DOB: 30-May-1996  Age: 29 y.o. MRN: [id number]  Chief Complaint  Patient presents with   Annual Exam    Patient is a 29 year old female seen for CPE.  Patient states she is doing well for the most part.  Started a new job which she enjoys.  Had several deaths in the family throughout 2025.  Hopeful 2026 will be better.  Last pap in 2024 with ASCUS, repeat in 1 yr advised.  Pt mentions recent vaginitis, took diflucan  last wk.  Did not have d/c, just irritation.    Patient Active Problem List   Diagnosis Date Noted   Term pregnancy 06/18/2021   SVD (spontaneous vaginal delivery) 06/18/2021   Respiratory tract infection due to COVID-19 virus 06/27/2020   Postpartum state 06/20/2016   PROM (premature rupture of membranes) 06/19/2016   Pilonidal abscess, I&D 10/10 09/03/2011   Past Medical History:  Diagnosis Date   Anemia    History of pre-eclampsia    Pilonidal cyst    Scoliosis    Pt known as a child; MD @ bedside when notified    Past Surgical History:  Procedure Laterality Date   INCISE AND DRAIN ABCESS  2012   pilonidal cyst   removal of cyst     WISDOM TOOTH EXTRACTION     Social History[1] Family History  Problem Relation Age of Onset   Seizures Mother    Migraines Mother    Arthritis Mother    Multiple sclerosis Mother    Asthma Father    Diabetes Maternal Grandmother    Stroke Maternal Grandfather    Diabetes Maternal Grandfather    Asthma Paternal Grandmother    Cancer Paternal Grandfather    Hypertension Neg Hx    Allergies[2]  ROS Negative unless stated above    Objective:     BP 98/78 (BP Location: Left Arm, Patient Position: Sitting, Cuff Size: Large)   Pulse 86   Temp 98 F (36.7 C) (Oral)   Ht 5' 4 (1.626 m)   Wt 171 lb 12.8 oz (77.9 kg)   LMP  (LMP Unknown)   SpO2 99%   BMI 29.49 kg/m  BP Readings from Last 3 Encounters:  12/12/24 98/78   06/21/24 111/69  08/31/23 92/69   Wt Readings from Last 3 Encounters:  12/12/24 171 lb 12.8 oz (77.9 kg)  06/21/24 171 lb (77.6 kg)  08/31/23 160 lb (72.6 kg)      Physical Exam Constitutional:      Appearance: Normal appearance.  HENT:     Head: Normocephalic and atraumatic.     Right Ear: Tympanic membrane, ear canal and external ear normal.     Left Ear: Tympanic membrane, ear canal and external ear normal.     Nose: Nose normal.     Mouth/Throat:     Mouth: Mucous membranes are moist.     Pharynx: No oropharyngeal exudate or posterior oropharyngeal erythema.  Eyes:     General: No scleral icterus.    Extraocular Movements: Extraocular movements intact.     Conjunctiva/sclera: Conjunctivae normal.     Pupils: Pupils are equal, round, and reactive to light.  Neck:     Thyroid: No thyromegaly.     Vascular: No carotid bruit.  Cardiovascular:     Rate and Rhythm: Normal rate and regular rhythm.     Pulses: Normal pulses.     Heart sounds:  Normal heart sounds. No murmur heard.    No friction rub.  Pulmonary:     Effort: Pulmonary effort is normal.     Breath sounds: Normal breath sounds. No wheezing, rhonchi or rales.  Abdominal:     General: Bowel sounds are normal.     Palpations: Abdomen is soft.     Tenderness: There is no abdominal tenderness.  Genitourinary:    Vagina: No vaginal discharge.     Cervix: Discharge and eversion present. No cervical motion tenderness, friability, lesion, erythema or cervical bleeding.     Uterus: Normal.      Rectum: Normal.     Comments: Large clitoral hood.  Normal vaginal rugae.  Cervix pointing down towards pt's R.  Scant whitish d/c in vaginal vault and at cervical os.   Musculoskeletal:        General: No deformity. Normal range of motion.  Lymphadenopathy:     Cervical: No cervical adenopathy.  Skin:    General: Skin is warm and dry.     Findings: No lesion.  Neurological:     General: No focal deficit present.      Mental Status: She is alert and oriented to person, place, and time.  Psychiatric:        Mood and Affect: Mood normal.        Thought Content: Thought content normal.        12/12/2024   10:40 AM 08/27/2023    1:17 PM 02/06/2023    1:23 PM  Depression screen PHQ 2/9  Decreased Interest 0 1 0  Down, Depressed, Hopeless 0 1 0  PHQ - 2 Score 0 2 0  Altered sleeping 0 1 0  Tired, decreased energy 0 1 0  Change in appetite 1 1 0  Feeling bad or failure about yourself  0 1 0  Trouble concentrating 0 1 0  Moving slowly or fidgety/restless 0 0 0  Suicidal thoughts 0 0 0  PHQ-9 Score 1 7  0   Difficult doing work/chores Not difficult at all Somewhat difficult      Data saved with a previous flowsheet row definition      12/12/2024   10:41 AM 08/27/2023    1:18 PM  GAD 7 : Generalized Anxiety Score  Nervous, Anxious, on Edge 0 1  Control/stop worrying 0 1  Worry too much - different things 0 1  Trouble relaxing 1 1  Restless 0 0  Easily annoyed or irritable 1 1  Afraid - awful might happen 0 1  Total GAD 7 Score 2 6  Anxiety Difficulty Not difficult at all Somewhat difficult     No results found for any visits on 12/12/24.    Assessment & Plan:   Well adult exam -     CBC with Differential/Platelet; Future -     Comprehensive metabolic panel with GFR; Future -     Hemoglobin A1c; Future -     Lipid panel; Future -     TSH; Future  Screening for cervical cancer -     Cytology - PAP  Age appropriate health screenings discussed.  Obtain labs.  Immunizations reviewed.  Flu up to date, done 08/22/24.  Pap done 02/13/23 with ASCUS.  Repeat pap this visit.  Discussed pap may still have ASCUS given recent/ongoing infection with d/c noted.  Treat for infection if needed based on results as currently asymptomatic.    Return in about 1 year (around 12/12/2025) for physical.  Sooner  if needed.   Clotilda JONELLE Single, MD     [1]  Social History Tobacco Use   Smoking status: Never    Smokeless tobacco: Never  Vaping Use   Vaping status: Never Used  Substance Use Topics   Alcohol use: No   Drug use: No  [2] No Known Allergies  "

## 2024-12-15 LAB — CYTOLOGY - PAP
Adequacy: ABSENT
Chlamydia: NEGATIVE
Comment: NEGATIVE
Comment: NEGATIVE
Comment: NEGATIVE
Comment: NORMAL
Diagnosis: UNDETERMINED — AB
High risk HPV: NEGATIVE
Neisseria Gonorrhea: NEGATIVE
Trichomonas: NEGATIVE

## 2024-12-16 ENCOUNTER — Ambulatory Visit: Payer: Self-pay | Admitting: Family Medicine

## 2024-12-22 NOTE — Telephone Encounter (Signed)
 Spoke with patient, she is having a milky discharge with no odor no itching, per Dr. Mercer patient is having Physiologic Discharge, if patient starts to having any other symptoms to call the office an sch a acute visit

## 2024-12-28 ENCOUNTER — Encounter: Payer: Self-pay | Admitting: Family Medicine

## 2025-06-21 ENCOUNTER — Ambulatory Visit: Payer: PRIVATE HEALTH INSURANCE | Admitting: Neurology
